# Patient Record
Sex: Male | Born: 1985 | Hispanic: No | Marital: Married | Smoking: Never smoker
Health system: Southern US, Community
[De-identification: ages and names within clinical notes are randomized; demographics above are authoritative.]

## PROBLEM LIST (undated history)

## (undated) DIAGNOSIS — J45909 Unspecified asthma, uncomplicated: Secondary | ICD-10-CM

## (undated) DIAGNOSIS — G4730 Sleep apnea, unspecified: Secondary | ICD-10-CM

## (undated) HISTORY — DX: Unspecified asthma, uncomplicated: J45.909

## (undated) HISTORY — DX: Sleep apnea, unspecified: G47.30

---

## 2018-06-23 DIAGNOSIS — J302 Other seasonal allergic rhinitis: Secondary | ICD-10-CM | POA: Diagnosis not present

## 2018-08-05 ENCOUNTER — Encounter: Payer: Self-pay | Admitting: Family Medicine

## 2018-08-05 ENCOUNTER — Ambulatory Visit (INDEPENDENT_AMBULATORY_CARE_PROVIDER_SITE_OTHER): Payer: BLUE CROSS/BLUE SHIELD | Admitting: Family Medicine

## 2018-08-05 VITALS — BP 124/70 | HR 58 | Temp 98.6°F | Ht 68.5 in | Wt 172.0 lb

## 2018-08-05 DIAGNOSIS — Z0001 Encounter for general adult medical examination with abnormal findings: Secondary | ICD-10-CM | POA: Diagnosis not present

## 2018-08-05 DIAGNOSIS — R1031 Right lower quadrant pain: Secondary | ICD-10-CM | POA: Diagnosis not present

## 2018-08-05 DIAGNOSIS — K5901 Slow transit constipation: Secondary | ICD-10-CM | POA: Insufficient documentation

## 2018-08-05 LAB — URINALYSIS, ROUTINE W REFLEX MICROSCOPIC
Bilirubin Urine: NEGATIVE
Hgb urine dipstick: NEGATIVE
Ketones, ur: NEGATIVE
Leukocytes,Ua: NEGATIVE
Nitrite: NEGATIVE
Specific Gravity, Urine: 1.025 (ref 1.000–1.030)
Total Protein, Urine: NEGATIVE
Urine Glucose: NEGATIVE
Urobilinogen, UA: 0.2 (ref 0.0–1.0)
pH: 5.5 (ref 5.0–8.0)

## 2018-08-05 LAB — COMPREHENSIVE METABOLIC PANEL
ALT: 25 U/L (ref 0–53)
AST: 24 U/L (ref 0–37)
Albumin: 4.7 g/dL (ref 3.5–5.2)
Alkaline Phosphatase: 64 U/L (ref 39–117)
BUN: 11 mg/dL (ref 6–23)
CO2: 28 mEq/L (ref 19–32)
Calcium: 9.5 mg/dL (ref 8.4–10.5)
Chloride: 105 mEq/L (ref 96–112)
Creatinine, Ser: 0.91 mg/dL (ref 0.40–1.50)
GFR: 96.42 mL/min (ref >60.00)
Glucose, Bld: 96 mg/dL (ref 70–99)
Potassium: 4.3 mEq/L (ref 3.5–5.1)
Sodium: 140 mEq/L (ref 135–145)
Total Bilirubin: 0.6 mg/dL (ref 0.2–1.2)
Total Protein: 7 g/dL (ref 6.0–8.3)

## 2018-08-05 LAB — CBC
HCT: 42.1 % (ref 39.0–52.0)
Hemoglobin: 15 g/dL (ref 13.0–17.0)
MCHC: 35.6 g/dL (ref 30.0–36.0)
MCV: 84.6 fl (ref 78.0–100.0)
Platelets: 165 10*3/uL (ref 150.0–400.0)
RBC: 4.98 Mil/uL (ref 4.22–5.81)
RDW: 13.9 % (ref 11.5–15.5)
WBC: 4.3 10*3/uL (ref 4.0–10.5)

## 2018-08-05 LAB — VITAMIN D 25 HYDROXY (VIT D DEFICIENCY, FRACTURES): VITD: 39.65 ng/mL (ref 30.00–100.00)

## 2018-08-05 LAB — LIPID PANEL
Cholesterol: 156 mg/dL (ref 0–200)
HDL: 45.6 mg/dL (ref >39.00)
LDL Cholesterol: 92 mg/dL (ref 0–99)
NonHDL: 110.83
Total CHOL/HDL Ratio: 3
Triglycerides: 95 mg/dL (ref 0.0–149.0)
VLDL: 19 mg/dL (ref 0.0–40.0)

## 2018-08-05 LAB — TSH: TSH: 2.24 u[IU]/mL (ref 0.35–4.50)

## 2018-08-05 MED ORDER — PSYLLIUM 58.6 % PO POWD: ORAL | 12 refills | Status: DC

## 2018-08-05 NOTE — Patient Instructions (Signed)
Constipation, Adult Constipation is when a person has fewer bowel movements in a week than normal, has difficulty having a bowel movement, or has stools that are dry, hard, or larger than normal. Constipation may be caused by an underlying condition. It may become worse with age if a person takes certain medicines and does not take in enough fluids. Follow these instructions at home: Eating and drinking   Eat foods that have a lot of fiber, such as fresh fruits and vegetables, whole grains, and beans.  Limit foods that are high in fat, low in fiber, or overly processed, such as french fries, hamburgers, cookies, candies, and soda.  Drink enough fluid to keep your urine clear or pale yellow. General instructions  Exercise regularly or as told by your health care provider.  Go to the restroom when you have the urge to go. Do not hold it in.  Take over-the-counter and prescription medicines only as told by your health care provider. These include any fiber supplements.  Practice pelvic floor retraining exercises, such as deep breathing while relaxing the lower abdomen and pelvic floor relaxation during bowel movements.  Watch your condition for any changes.  Keep all follow-up visits as told by your health care provider. This is important. Contact a health care provider if:  You have pain that gets worse.  You have a fever.  You do not have a bowel movement after 4 days.  You vomit.  You are not hungry.  You lose weight.  You are bleeding from the anus.  You have thin, pencil-like stools. Get help right away if:  You have a fever and your symptoms suddenly get worse.  You leak stool or have blood in your stool.  Your abdomen is bloated.  You have severe pain in your abdomen.  You feel dizzy or you faint. This information is not intended to replace advice given to you by your health care provider. Make sure you discuss any questions you have with your health care  provider. Document Released: 11/25/2003 Document Revised: 09/16/2015 Document Reviewed: 08/17/2015 Elsevier Interactive Patient Education  2019 [employer]  Health Maintenance, Male A healthy lifestyle and preventive care is important for your health and wellness. Ask your health care provider about what schedule of regular examinations is right for you. What should I know about weight and diet? Eat a Healthy Diet  Eat plenty of vegetables, fruits, whole grains, low-fat dairy products, and lean protein.  Do not eat a lot of foods high in solid fats, added sugars, or salt.  Maintain a Healthy Weight Regular exercise can help you achieve or maintain a healthy weight. You should:  Do at least 150 minutes of exercise each week. The exercise should increase your heart rate and make you sweat (moderate-intensity exercise).  Do strength-training exercises at least twice a week. Watch Your Levels of Cholesterol and Blood Lipids  Have your blood tested for lipids and cholesterol every 5 years starting at 32 years of age. If you are at high risk for heart disease, you should start having your blood tested when you are 32 years old. You may need to have your cholesterol levels checked more often if: ? Your lipid or cholesterol levels are high. ? You are older than 32 years of age. ? You are at high risk for heart disease. What should I know about cancer screening? Many types of cancers can be detected early and may often be prevented. Lung Cancer  You should be screened  every year for lung cancer if: ? You are a current smoker who has smoked for at least 30 years. ? You are a former smoker who has quit within the past 15 years.  Talk to your health care provider about your screening options, when you should start screening, and how often you should be screened. Colorectal Cancer  Routine colorectal cancer screening usually begins at 31 years of age and should be repeated every 5-10 years  until you are 32 years old. You may need to be screened more often if early forms of precancerous polyps or small growths are found. Your health care provider may recommend screening at an earlier age if you have risk factors for colon cancer.  Your health care provider may recommend using home test kits to check for hidden blood in the stool.  A small camera at the end of a tube can be used to examine your colon (sigmoidoscopy or colonoscopy). This checks for the earliest forms of colorectal cancer. Prostate and Testicular Cancer  Depending on your age and overall health, your health care provider may do certain tests to screen for prostate and testicular cancer.  Talk to your health care provider about any symptoms or concerns you have about testicular or prostate cancer. Skin Cancer  Check your skin from head to toe regularly.  Tell your health care provider about any new moles or changes in moles, especially if: ? There is a change in a mole's size, shape, or color. ? You have a mole that is larger than a pencil eraser.  Always use sunscreen. Apply sunscreen liberally and repeat throughout the day.  Protect yourself by wearing long sleeves, pants, a wide-brimmed hat, and sunglasses when outside. What should I know about heart disease, diabetes, and high blood pressure?  If you are 41-41 years of age, have your blood pressure checked every 3-5 years. If you are 54 years of age or older, have your blood pressure checked every year. You should have your blood pressure measured twice-once when you are at a hospital or clinic, and once when you are not at a hospital or clinic. Record the average of the two measurements. To check your blood pressure when you are not at a hospital or clinic, you can use: ? An automated blood pressure machine at a pharmacy. ? A home blood pressure monitor.  Talk to your health care provider about your target blood pressure.  If you are between 108-79 years  old, ask your health care provider if you should take aspirin to prevent heart disease.  Have regular diabetes screenings by checking your fasting blood sugar level. ? If you are at a normal weight and have a low risk for diabetes, have this test once every three years after the age of 61. ? If you are overweight and have a high risk for diabetes, consider being tested at a younger age or more often.  A one-time screening for abdominal aortic aneurysm (AAA) by ultrasound is recommended for men aged 65-75 years who are current or former smokers. What should I know about preventing infection? Hepatitis B If you have a higher risk for hepatitis B, you should be screened for this virus. Talk with your health care provider to find out if you are at risk for hepatitis B infection. Hepatitis C Blood testing is recommended for:  Everyone born from [birth year] through 1964.  Anyone with known risk factors for hepatitis C. Sexually Transmitted Diseases (STDs)  You should be screened  each year for STDs including gonorrhea and chlamydia if: ? You are sexually active and are younger than 32 years of age. ? You are older than 32 years of age and your health care provider tells you that you are at risk for this type of infection. ? Your sexual activity has changed since you were last screened and you are at an increased risk for chlamydia or gonorrhea. Ask your health care provider if you are at risk.  Talk with your health care provider about whether you are at high risk of being infected with HIV. Your health care provider may recommend a prescription medicine to help prevent HIV infection. What else can I do?  Schedule regular health, dental, and eye exams.  Stay current with your vaccines (immunizations).  Do not use any tobacco products, such as cigarettes, chewing tobacco, and e-cigarettes. If you need help quitting, ask your health care provider.  Limit alcohol intake to no more than 2 drinks per  day. One drink equals 12 ounces of beer, 5 ounces of wine, or 1 ounces of hard liquor.  Do not use street drugs.  Do not share needles.  Ask your health care provider for help if you need support or information about quitting drugs.  Tell your health care provider if you often feel depressed.  Tell your health care provider if you have ever been abused or do not feel safe at home. This information is not intended to replace advice given to you by your health care provider. Make sure you discuss any questions you have with your health care provider. Document Released: 08/25/2007 Document Revised: 10/26/2015 Document Reviewed: 11/30/2014 Elsevier Interactive Patient Education  2019 [employer].

## 2018-08-05 NOTE — Progress Notes (Signed)
Established Patient Office Visit  Subjective:  Patient ID: Patrick Parsons, male    DOB: October 05, 1985  Age: 33 y.o. MRN: [id number]  CC:  Chief Complaint  Patient presents with  . Establish Care  . Annual Exam    HPI Patrick Parsons presents for a complete physical.  He is fasting this morning.  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  His parents live back in [place].  He has a cousin who lives close by.  Patient does not smoke drink or use illicit drugs.  He is currently not exercising much.  He enjoys good health as far as he knows.  He has noted some intermittent right lower quadrant pain over the last month or 2.  He is also been dealing with constipation over this time.  He does not report any weight loss night sweats blood in his stool or urine.  He has had no abdominal surgeries.  He is recently tried to increase the fiber in his diet.  His father is 21 and suffers from diabetes and heart disease.  Patient is concerned about diabetes and himself.  He denies weight loss increased urination other than when he drinks more fluids.  Denies a decrease in his urine flow or difficulty with urination.  History reviewed. No pertinent past medical history.  History reviewed. No pertinent surgical history.  Family History  Problem Relation Age of Onset  . Diabetes Father   . Heart disease Father     Social History   Socioeconomic History  . Marital status: Married    Spouse name: Not on file  . Number of children: Not on file  . Years of education: Not on file  . Highest education level: Not on file  Occupational History  . Not on file  Social Needs  . Financial resource strain: Not on file  . Food insecurity:    Worry: Not on file    Inability: Not on file  . Transportation needs:    Medical: Not on file    Non-medical: Not on file  Tobacco Use  . Smoking status: Never Smoker  . Smokeless tobacco: Never Used  Substance and Sexual Activity  . Alcohol use: Never     Frequency: Never  . Drug use: Never  . Sexual activity: Not on file  Lifestyle  . Physical activity:    Days per week: Not on file    Minutes per session: Not on file  . Stress: Not on file  Relationships  . Social connections:    Talks on phone: Not on file    Gets together: Not on file    Attends religious service: Not on file    Active member of club or organization: Not on file    Attends meetings of clubs or organizations: Not on file    Relationship status: Not on file  . Intimate partner violence:    Fear of current or ex partner: Not on file    Emotionally abused: Not on file    Physically abused: Not on file    Forced sexual activity: Not on file  Other Topics Concern  . Not on file  Social History Narrative  . Not on file    No outpatient medications prior to visit.   No facility-administered medications prior to visit.     No Known Allergies  ROS Review of Systems  Constitutional: Negative for chills, diaphoresis, fatigue, fever and unexpected weight change.  HENT: Negative.   Eyes: Negative  for photophobia and visual disturbance.  Respiratory: Negative.   Cardiovascular: Negative.   Gastrointestinal: Positive for abdominal pain and constipation. Negative for anal bleeding, blood in stool, diarrhea, nausea, rectal pain and vomiting.  Endocrine: Negative for polyphagia and polyuria.  Genitourinary: Negative for difficulty urinating and urgency.  Musculoskeletal: Negative for arthralgias, gait problem and myalgias.  Skin: Negative for pallor and wound.  Allergic/Immunologic: Negative for immunocompromised state.  Neurological: Negative for light-headedness and headaches.  Hematological: Does not bruise/bleed easily.  Psychiatric/Behavioral: Negative.       Objective:    Physical Exam  Constitutional: He is oriented to person, place, and time. He appears well-developed and well-nourished. No distress.  HENT:  Head: Normocephalic and atraumatic.   Right Ear: External ear normal.  Left Ear: External ear normal.  Mouth/Throat: Oropharynx is clear and moist. No oropharyngeal exudate.  Eyes: Pupils are equal, round, and reactive to light. Conjunctivae and EOM are normal. Right eye exhibits no discharge. Left eye exhibits no discharge. No scleral icterus.  Neck: Neck supple. No JVD present. No tracheal deviation present. No thyromegaly present.  Cardiovascular: Normal rate, regular rhythm and normal heart sounds.  Pulmonary/Chest: Effort normal and breath sounds normal. No stridor.  Abdominal: Soft. Bowel sounds are normal. He exhibits no distension. There is no abdominal tenderness. There is no rebound and no guarding.  Musculoskeletal:        General: No edema.  Lymphadenopathy:    He has no cervical adenopathy.  Neurological: He is alert and oriented to person, place, and time.  Skin: Skin is warm and dry. He is not diaphoretic.  Psychiatric: He has a normal mood and affect. His behavior is normal.    BP 124/70   Pulse (!) 58   Temp 98.6 F (37 C) (Oral)   Ht 5' 8.5" (1.74 m)   Wt 172 lb (78 kg)   SpO2 98%   BMI 25.77 kg/m  Wt Readings from Last 3 Encounters:  08/05/18 172 lb (78 kg)   BP Readings from Last 3 Encounters:  08/05/18 124/70   Guideline developer:  UpToDate (see UpToDate for funding source) Date Released: June 2014  Health Maintenance Due  Topic Date Due  . HIV Screening  05/19/2001  . TETANUS/TDAP  05/19/2005    There are no preventive care reminders to display for this patient.  No results found for: TSH No results found for: WBC, HGB, HCT, MCV, PLT No results found for: NA, K, CHLORIDE, CO2, GLUCOSE, BUN, CREATININE, BILITOT, ALKPHOS, AST, ALT, PROT, ALBUMIN, CALCIUM, ANIONGAP, EGFR, GFR No results found for: CHOL No results found for: HDL No results found for: LDLCALC No results found for: TRIG No results found for: CHOLHDL No results found for: HGBA1C    Assessment & Plan:   Problem List  Items Addressed This Visit      Digestive   Constipation by delayed colonic transit   Relevant Medications   psyllium (METAMUCIL SMOOTH TEXTURE) 58.6 % powder   Other Relevant Orders   TSH     Other   Encounter for health maintenance examination with abnormal findings - Primary   Relevant Orders   CBC   Comprehensive metabolic panel   Lipid panel   Urinalysis, Routine w reflex microscopic   VITAMIN D 25 Hydroxy (Vit-D Deficiency, Fractures)   Right lower quadrant abdominal pain   Relevant Orders   CBC   Comprehensive metabolic panel   Urinalysis, Routine w reflex microscopic   TSH  Meds ordered this encounter  Medications  . psyllium (METAMUCIL SMOOTH TEXTURE) 58.6 % powder    Sig: Dissolve one packet in 8oz of water up to 3 times daily as needed for constipation.    Dispense:  283 g    Refill:  12    Follow-up: Return in about 1 month (around 09/05/2018).    Encouraged patient to increase his exercise and increase the fiber in his diet naturally.  He will try Metamucil.  He was given information on health maintenance and disease prevention.  He was also given information on constipation.  Believe that his abdominal pain is probably related to his constipation.  He will follow-up in 1 month for recheck.

## 2018-08-07 ENCOUNTER — Encounter: Payer: Self-pay | Admitting: Family Medicine

## 2018-09-03 ENCOUNTER — Telehealth: Payer: Self-pay

## 2018-09-03 NOTE — Telephone Encounter (Signed)

## 2018-09-04 ENCOUNTER — Encounter: Payer: Self-pay | Admitting: Family Medicine

## 2018-09-04 ENCOUNTER — Ambulatory Visit (INDEPENDENT_AMBULATORY_CARE_PROVIDER_SITE_OTHER): Payer: BC Managed Care – PPO | Admitting: Family Medicine

## 2018-09-04 VITALS — BP 110/70 | HR 76 | Ht 68.5 in | Wt 176.1 lb

## 2018-09-04 DIAGNOSIS — J301 Allergic rhinitis due to pollen: Secondary | ICD-10-CM

## 2018-09-04 DIAGNOSIS — J45909 Unspecified asthma, uncomplicated: Secondary | ICD-10-CM | POA: Insufficient documentation

## 2018-09-04 NOTE — Patient Instructions (Signed)
Allergic Rhinitis, Adult Allergic rhinitis is an allergic reaction that affects the mucous membrane inside the nose. It causes sneezing, a runny or stuffy nose, and the feeling of mucus going down the back of the throat (postnasal drip). Allergic rhinitis can be mild to severe. There are two types of allergic rhinitis:  Seasonal. This type is also called hay fever. It happens only during certain seasons.  Perennial. This type can happen at any time of the year. What are the causes? This condition happens when the body's defense system (immune system) responds to certain harmless substances called allergens as though they were germs.  Seasonal allergic rhinitis is triggered by pollen, which can come from grasses, trees, and weeds. Perennial allergic rhinitis may be caused by:  House dust mites.  Pet dander.  Mold spores. What are the signs or symptoms? Symptoms of this condition include:  Sneezing.  Runny or stuffy nose (nasal congestion).  Postnasal drip.  Itchy nose.  Tearing of the eyes.  Trouble sleeping.  Daytime sleepiness. How is this diagnosed? This condition may be diagnosed based on:  Your medical history.  A physical exam.  Tests to check for related conditions, such as: ? Asthma. ? Pink eye. ? Ear infection. ? Upper respiratory infection.  Tests to find out which allergens trigger your symptoms. These may include skin or blood tests. How is this treated? There is no cure for this condition, but treatment can help control symptoms. Treatment may include:  Taking medicines that block allergy symptoms, such as antihistamines. Medicine may be given as a shot, nasal spray, or pill.  Avoiding the allergen.  Desensitization. This treatment involves getting ongoing shots until your body becomes less sensitive to the allergen. This treatment may be done if other treatments do not help.  If taking medicine and avoiding the allergen does not work, new, stronger  medicines may be prescribed. Follow these instructions at home:  Find out what you are allergic to. Common allergens include smoke, dust, and pollen.  Avoid the things you are allergic to. These are some things you can do to help avoid allergens: ? Replace carpet with wood, tile, or vinyl flooring. Carpet can trap dander and dust. ? Do not smoke. Do not allow smoking in your home. ? Change your heating and air conditioning filter at least once a month. ? During allergy season:  Keep windows closed as much as possible.  Plan outdoor activities when pollen counts are lowest. This is usually during the evening hours.  When coming indoors, change clothing and shower before sitting on furniture or bedding.  Take over-the-counter and prescription medicines only as told by your health care provider.  Keep all follow-up visits as told by your health care provider. This is important. Contact a health care provider if:  You have a fever.  You develop a persistent cough.  You make whistling sounds when you breathe (you wheeze).  Your symptoms interfere with your normal daily activities. Get help right away if:  You have shortness of breath. Summary  This condition can be managed by taking medicines as directed and avoiding allergens.  Contact your health care provider if you develop a persistent cough or fever.  During allergy season, keep windows closed as much as possible. This information is not intended to replace advice given to you by your health care provider. Make sure you discuss any questions you have with your health care provider. Document Released: 11/21/2000 Document Revised: 04/05/2016 Document Reviewed: 04/05/2016 Elsevier Interactive  develop a persistent cough or fever.  · During allergy season, keep windows closed as much as possible.  This information is not intended to replace advice given to you by your health care provider. Make sure you discuss any questions you have with your health care provider.  Document Released: 11/21/2000 Document Revised: 04/05/2016 Document Reviewed: 04/05/2016  Elsevier Interactive Patient Education © 2019 Elsevier Inc.      Asthma, Adult    Asthma is a long-term (chronic) condition that causes recurrent episodes in which the airways become tight and narrow. The airways are the passages that lead from the nose and  mouth down into the lungs. Asthma episodes, also called asthma attacks, can cause coughing, wheezing, shortness of breath, and chest pain. The airways can also fill with mucus. During an attack, it can be difficult to breathe. Asthma attacks can range from minor to life threatening.  Asthma cannot be cured, but medicines and lifestyle changes can help control it and treat acute attacks.  What are the causes?  This condition is believed to be caused by inherited (genetic) and environmental factors, but its exact cause is not known.  There are many things that can bring on an asthma attack or make asthma symptoms worse (triggers). Asthma triggers are different for each person. Common triggers include:  · Mold.  · Dust.  · Cigarette smoke.  · Cockroaches.  · Things that can cause allergy symptoms (allergens), such as animal dander or pollen from trees or grass.  · Air pollutants such as household cleaners, wood smoke, smog, or chemical odors.  · Cold air, weather changes, and winds (which increase molds and pollen in the air).  · Strong emotional expressions such as crying or laughing hard.  · Stress.  · Certain medicines (such as aspirin) or types of medicines (such as beta-blockers).  · Sulfites in foods and drinks. Foods and drinks that may contain sulfites include dried fruit, potato chips, and sparkling grape juice.  · Infections or inflammatory conditions such as the flu, a cold, or inflammation of the nasal membranes (rhinitis).  · Gastroesophageal reflux disease (GERD).  · Exercise or strenuous activity.  What are the signs or symptoms?  Symptoms of this condition may occur right after asthma is triggered or many hours later. Symptoms include:  · Wheezing. This can sound like whistling when you breathe.  · Excessive nighttime or early morning coughing.  · Frequent or severe coughing with a common cold.  · Chest tightness.  · Shortness of breath.  · Tiredness (fatigue) with minimal activity.  How is this  diagnosed?  This condition is diagnosed based on:  · Your medical history.  · A physical exam.  · Tests, which may include:  ? Lung function studies and pulmonary studies (spirometry). These tests can evaluate the flow of air in your lungs.  ? Allergy tests.  ? Imaging tests, such as X-rays.  How is this treated?  There is no cure for this condition, but treatment can help control your symptoms. Treatment for asthma usually involves:  · Identifying and avoiding your asthma triggers.  · Using medicines to control your symptoms. Generally, two types of medicines are used to treat asthma:  ? Controller medicines. These help prevent asthma symptoms from occurring. They are usually taken every day.  ? Fast-acting reliever or rescue medicines. These quickly relieve asthma symptoms by widening the narrow and tight airways. They are used as needed and provide short-term relief.  · Using supplemental oxygen.   This may be needed during a severe episode.  · Using other medicines, such as:  ? Allergy medicines, such as antihistamines, if your asthma attacks are triggered by allergens.  ? Immune medicines (immunomodulators). These are medicines that help control the immune system.  · Creating an asthma action plan. An asthma action plan is a written plan for managing and treating your asthma attacks. This plan includes:  ? A list of your asthma triggers and how to avoid them.  ? Information about when medicines should be taken and when their dosage should be changed.  ? Instructions about using a device called a peak flow meter. A peak flow meter measures how well the lungs are working and the severity of your asthma. It helps you monitor your condition.  Follow these instructions at home:  Controlling your home environment  Control your home environment in the following ways to help avoid triggers and prevent asthma attacks:  · Change your heating and air conditioning filter regularly.  · Limit your use of fireplaces and wood  stoves.  · Get rid of pests (such as roaches and mice) and their droppings.  · Throw away plants if you see mold on them.  · Clean floors and dust surfaces regularly. Use unscented cleaning products.  · Try to have someone else vacuum for you regularly. Stay out of rooms while they are being vacuumed and for a short while afterward. If you vacuum, use a dust mask from a hardware store, a double-layered or microfilter vacuum cleaner bag, or a vacuum cleaner with a HEPA filter.  · Replace carpet with wood, tile, or vinyl flooring. Carpet can trap dander and dust.  · Use allergy-proof pillows, mattress covers, and box spring covers.  · Keep your bedroom a trigger-free room.  · Avoid pets and keep windows closed when allergens are in the air.  · Wash beddings every week in hot water and dry them in a dryer.  · Use blankets that are made of polyester or cotton.  · Clean bathrooms and kitchens with bleach. If possible, have someone repaint the walls in these rooms with mold-resistant paint. Stay out of the rooms that are being cleaned and painted.  · Wash your hands often with soap and water. If soap and water are not available, use hand sanitizer.  · Do not allow anyone to smoke in your home.  General instructions  · Take over-the-counter and prescription medicines only as told by your health care provider.  ? Speak with your health care provider if you have questions about how or when to take the medicines.  ? Make note if you are requiring more frequent dosages.  · Do not use any products that contain nicotine or tobacco, such as cigarettes and e-cigarettes. If you need help quitting, ask your health care provider. Also, avoid being exposed to secondhand smoke.  · Use a peak flow meter as told by your health care provider. Record and keep track of the readings.  · Understand and use the asthma action plan to help minimize, or stop an asthma attack, without needing to seek medical care.  · Make sure you stay up to date  on your yearly vaccinations as told by your health care provider. This may include vaccines for the flu and pneumonia.  · Avoid outdoor activities when allergen counts are high and when air quality is low.  · Wear a ski mask that covers your nose and mouth during outdoor winter activities. Exercise indoors   times a week.  Your peak flow reading is still at 50-79% of your personal best after following your action plan for 1 hour.  You have a fever. Get help right away if:  You are getting worse and do not respond to treatment during an asthma attack.  You are short of breath when at rest or when doing very little physical activity.  You have difficulty eating, drinking, or talking.  You have chest pain or tightness.  You develop a fast heartbeat or palpitations.  You have a bluish color to your lips or fingernails.  You are light-headed or dizzy, or you faint.  Your peak flow reading is less than 50% of your personal best.  You feel too tired to breathe normally. Summary  Asthma is a long-term (chronic) condition that causes recurrent episodes in which the airways  become tight and narrow. These episodes can cause coughing, wheezing, shortness of breath, and chest pain.  Asthma cannot be cured, but medicines and lifestyle changes can help control it and treat acute attacks.  Make sure you understand how to avoid triggers and how and when to use your medicines.  Asthma attacks can range from minor to life threatening. Get help right away if you have an asthma attack and do not respond to treatment with your usual rescue medicines. This information is not intended to replace advice given to you by your health care provider. Make sure you discuss any questions you have with your health care provider. Document Released: 02/26/2005 Document Revised: 04/02/2016 Document Reviewed: 04/02/2016 Elsevier Interactive Patient Education  2019 [employer].

## 2018-09-04 NOTE — Progress Notes (Signed)
Established Patient Office Visit  Subjective:  Patient ID: Patrick Parsons, male    DOB: [birth year]  Age: 33 y.o. MRN: [id number]  CC:  Chief Complaint  Patient presents with  . Follow-up  . Allergies    HPI Patrick Parsons presents for follow-up of his constipation and abdominal pain.  Both have improved status post using the Metamucil intermittently as needed.  He has also been exercising by walking and this seems to have helped to.  Reports stooling every 1 to 2 days.  There is been no blood loss weight loss or significant abdominal pain.  Ongoing history of allergy rhinitis.  He was diagnosed allergic to olive tree pollen back in [place].  He tells of wheezing after mowing his lawn.  He has no allergy history.  He does not smoke.  He called in for tele-visit and was given an inhaler and a pill to take.  He has used the inhaler intermittently as needed.  History reviewed. No pertinent past medical history.  History reviewed. No pertinent surgical history.  Family History  Problem Relation Age of Onset  . Diabetes Father   . Heart disease Father     Social History   Socioeconomic History  . Marital status: Married    Spouse name: Not on file  . Number of children: Not on file  . Years of education: Not on file  . Highest education level: Not on file  Occupational History  . Not on file  Social Needs  . Financial resource strain: Not on file  . Food insecurity    Worry: Not on file    Inability: Not on file  . Transportation needs    Medical: Not on file    Non-medical: Not on file  Tobacco Use  . Smoking status: Never Smoker  . Smokeless tobacco: Never Used  Substance and Sexual Activity  . Alcohol use: Never    Frequency: Never  . Drug use: Never  . Sexual activity: Not on file  Lifestyle  . Physical activity    Days per week: Not on file    Minutes per session: Not on file  . Stress: Not on file  Relationships  . Social Musicianconnections    Talks on phone: Not on  file    Gets together: Not on file    Attends religious service: Not on file    Active member of club or organization: Not on file    Attends meetings of clubs or organizations: Not on file    Relationship status: Not on file  . Intimate partner violence    Fear of current or ex partner: Not on file    Emotionally abused: Not on file    Physically abused: Not on file    Forced sexual activity: Not on file  Other Topics Concern  . Not on file  Social History Narrative  . Not on file    Outpatient Medications Prior to Visit  Medication Sig Dispense Refill  . psyllium (METAMUCIL SMOOTH TEXTURE) 58.6 % powder Dissolve one packet in 8oz of water up to 3 times daily as needed for constipation. 283 g 12   No facility-administered medications prior to visit.     No Known Allergies  ROS Review of Systems  Constitutional: Negative for chills, diaphoresis, fatigue, fever and unexpected weight change.  HENT: Positive for postnasal drip and sneezing.   Eyes: Negative for photophobia.  Respiratory: Positive for cough and wheezing.   Cardiovascular: Negative.   Gastrointestinal: Negative  for abdominal pain, anal bleeding, blood in stool, constipation and diarrhea.  Endocrine: Negative for polyphagia and polyuria.  Genitourinary: Negative.   Musculoskeletal: Negative for arthralgias and joint swelling.  Allergic/Immunologic: Negative for immunocompromised state.  Neurological: Negative for light-headedness and headaches.  Hematological: Does not bruise/bleed easily.  Psychiatric/Behavioral: Negative.       Objective:    Physical Exam  Constitutional: He is oriented to person, place, and time. He appears well-developed and well-nourished. No distress.  HENT:  Head: Normocephalic and atraumatic.  Right Ear: External ear normal.  Left Ear: External ear normal.  Eyes: Pupils are equal, round, and reactive to light. Conjunctivae are normal. Right eye exhibits no discharge. Left eye  exhibits no discharge. No scleral icterus.  Neck: Neck supple. No JVD present. No tracheal deviation present. No thyromegaly present.  Cardiovascular: Normal rate, regular rhythm and normal heart sounds.  Pulmonary/Chest: Effort normal and breath sounds normal. No stridor.  Abdominal: Bowel sounds are normal. He exhibits no distension. There is no abdominal tenderness. There is no rebound.  Musculoskeletal:        General: No edema.  Lymphadenopathy:    He has no cervical adenopathy.  Neurological: He is alert and oriented to person, place, and time.  Skin: Skin is warm and dry. He is not diaphoretic.  Psychiatric: He has a normal mood and affect. His behavior is normal.    BP 110/70   Pulse 76   Ht 5' 8.5" (1.74 m)   Wt 176 lb 2 oz (79.9 kg)   SpO2 99%   BMI 26.39 kg/m  Wt Readings from Last 3 Encounters:  09/04/18 176 lb 2 oz (79.9 kg)  08/05/18 172 lb (78 kg)   BP Readings from Last 3 Encounters:  09/04/18 110/70  08/05/18 124/70   Guideline developer:  UpToDate (see UpToDate for funding source) Date Released: June 2014  Health Maintenance Due  Topic Date Due  . HIV Screening  05/19/2001  . TETANUS/TDAP  05/19/2005    There are no preventive care reminders to display for this patient.  Lab Results  Component Value Date   TSH 2.24 08/05/2018   Lab Results  Component Value Date   WBC 4.3 08/05/2018   HGB 15.0 08/05/2018   HCT 42.1 08/05/2018   MCV 84.6 08/05/2018   PLT 165.0 08/05/2018   Lab Results  Component Value Date   NA 140 08/05/2018   K 4.3 08/05/2018   CO2 28 08/05/2018   GLUCOSE 96 08/05/2018   BUN 11 08/05/2018   CREATININE 0.91 08/05/2018   BILITOT 0.6 08/05/2018   ALKPHOS 64 08/05/2018   AST 24 08/05/2018   ALT 25 08/05/2018   PROT 7.0 08/05/2018   ALBUMIN 4.7 08/05/2018   CALCIUM 9.5 08/05/2018   GFR 96.42 08/05/2018   Lab Results  Component Value Date   CHOL 156 08/05/2018   Lab Results  Component Value Date   HDL 45.60  08/05/2018   Lab Results  Component Value Date   LDLCALC 92 08/05/2018   Lab Results  Component Value Date   TRIG 95.0 08/05/2018   Lab Results  Component Value Date   CHOLHDL 3 08/05/2018   No results found for: HGBA1C    Assessment & Plan:   Problem List Items Addressed This Visit      Respiratory   Mild reactive airways disease   Relevant Orders   Ambulatory referral to Allergy   Seasonal allergic rhinitis due to pollen - Primary   Relevant  Orders   Ambulatory referral to Allergy      No orders of the defined types were placed in this encounter.   Follow-up: Return if symptoms worsen or fail to improve.

## 2018-09-09 ENCOUNTER — Encounter: Payer: Self-pay | Admitting: Allergy & Immunology

## 2018-09-09 ENCOUNTER — Other Ambulatory Visit: Payer: Self-pay

## 2018-09-09 ENCOUNTER — Ambulatory Visit (INDEPENDENT_AMBULATORY_CARE_PROVIDER_SITE_OTHER): Payer: BC Managed Care – PPO | Admitting: Allergy & Immunology

## 2018-09-09 VITALS — BP 116/70 | HR 75 | Temp 98.1°F | Resp 16 | Ht 68.0 in | Wt 174.0 lb

## 2018-09-09 DIAGNOSIS — J302 Other seasonal allergic rhinitis: Secondary | ICD-10-CM

## 2018-09-09 DIAGNOSIS — J3089 Other allergic rhinitis: Secondary | ICD-10-CM | POA: Diagnosis not present

## 2018-09-09 DIAGNOSIS — J4530 Mild persistent asthma, uncomplicated: Secondary | ICD-10-CM | POA: Diagnosis not present

## 2018-09-09 MED ORDER — MONTELUKAST SODIUM 10 MG PO TABS: 10.0000 mg | ORAL_TABLET | Freq: Every day | ORAL | 5 refills | Status: DC

## 2018-09-09 MED ORDER — QVAR REDIHALER 80 MCG/ACT IN AERB: INHALATION_SPRAY | RESPIRATORY_TRACT | 5 refills | Status: DC

## 2018-09-09 NOTE — Patient Instructions (Addendum)
1. Mild persistent asthma, uncomplicated - Spirometry was technically normal, but it did improve even more with albuterol. - I think you would benefit from adding on an inhaled steroid to help decrease mucous production and decrease the effectiveness of the allergens interfering with your breathing. - Spacer sample and demonstration provided. - Daily controller medication(s): Singulair 10mg  daily and Qvar 60mcg Redihaler 2 puffs twice daily - Prior to physical activity: albuterol 2 puffs 10-15 minutes before physical activity. - Rescue medications: albuterol 4 puffs every 4-6 hours as needed - Asthma control goals:  * Full participation in all desired activities (may need albuterol before activity) * Albuterol use two time or less a week on average (not counting use with activity) * Cough interfering with sleep two time or less a month * Oral steroids no more than once a year * No hospitalizations  2. Seasonal allergic rhinitis due to pollen - Testing today showed: grasses, weeds, trees and indoor molds (grasses were the dominant allergen) - Copy of test results provided.  - Avoidance measures provided. - Continue with: Singulair (montelukast) 10mg  daily - Start taking: Zyrtec (cetirizine) 10mg  tablet once daily and Nasacort (triamcinolone) one spray per nostril daily (especially on the days when you will be outdoors and exposed to pollen) - You can use an extra dose of the antihistamine, if needed, for breakthrough symptoms.  - Consider nasal saline rinses 1-2 times daily to remove allergens from the nasal cavities as well as help with mucous clearance (this is especially helpful to do before the nasal sprays are given) - Consider allergy shots as a means of long-term control. - Allergy shots "re-train" and "reset" the immune system to ignore environmental allergens and decrease the resulting immune response to those allergens (sneezing, itchy watery eyes, runny nose, nasal congestion, etc).     - Allergy shots improve symptoms in 75-85% of patients.  - We can discuss more at the next appointment if the medications are not working for you.  3. Return in about 3 months (around 12/10/2018). This can be an in-person, a virtual Webex or a telephone follow up visit.   Please inform us of any Emergency Department visits, hospitalizations, or changes in symptoms. Call us before going to the ED for breathing or allergy symptoms since we might be able to fit you in for a sick visit. Feel free to contact us anytime with any questions, problems, or concerns.  It was a pleasure to meet you today!  Websites that have reliable patient information: 1. American Academy of Asthma, Allergy, and Immunology: www.aaaai.org 2. Food Allergy Research and Education (FARE): foodallergy.org 3. Mothers of Asthmatics: http://www.asthmacommunitynetwork.org 4. American College of Allergy, Asthma, and Immunology: www.acaai.org  "Like" [place] on Facebook and Instagram for our latest updates!      Make sure you are registered to vote! If you have moved or changed any of your contact information, you will need to get this updated before voting!  In some cases, you MAY be able to register to vote online: [email or web address]    Voter ID laws are NOT going into effect for the General Election in November 2020! DO NOT let this stop you from exercising your right to vote!   Absentee voting is the SAFEST way to vote during the coronavirus pandemic!   Download and print an absentee ballot request form at [email or web address] or you can scan the QR code below with your smart phone:      More information on absentee ballots can be found  here: [email or web address]     Reducing Pollen Exposure  The American Academy of Allergy, Asthma and Immunology suggests the following steps to reduce your exposure to pollen during allergy seasons.    1. Do not hang sheets or  clothing out to dry; pollen may collect on these items. 2. Do not mow lawns or spend time around freshly cut grass; mowing stirs up pollen. 3. Keep windows closed at night.  Keep car windows closed while driving. 4. Minimize morning activities outdoors, a time when pollen counts are usually at their highest. 5. Stay indoors as much as possible when pollen counts or humidity is high and on windy days when pollen tends to remain in the air longer. 6. Use air conditioning when possible.  Many air conditioners have filters that trap the pollen spores. 7. Use a HEPA room air filter to remove pollen form the indoor air you breathe.  Control of Mold Allergen   Mold and fungi can grow on a variety of surfaces provided certain temperature and moisture conditions exist.  Outdoor molds grow on plants, decaying vegetation and soil.  The major outdoor mold, Alternaria and Cladosporium, are found in very high numbers during hot and dry conditions.  Generally, a late Summer - Fall peak is seen for common outdoor fungal spores.  Rain will temporarily lower outdoor mold spore count, but counts rise rapidly when the rainy period ends.  The most important indoor molds are Aspergillus and Penicillium.  Dark, humid and poorly ventilated basements are ideal sites for mold growth.  The next most common sites of mold growth are the bathroom and the kitchen.   Indoor (Perennial) Mold Control   Positive indoor molds via skin testing: Trichophyton  1. Maintain humidity below 50%. 2. Clean washable surfaces with 5% bleach solution. 3. Remove sources e.g. contaminated carpets.

## 2018-09-09 NOTE — Progress Notes (Signed)
NEW PATIENT  Date of Service/Encounter:  09/09/18  Referring provider: Mliss SaxKremer, William Alfred, MD `   Assessment:   Mild persistent asthma, uncomplicated   Seasonal and perennial allergic rhinitis (grasses, weeds, trees and indoor molds)  Plan/Recommendations:   1. Mild persistent asthma, uncomplicated - Spirometry was technically normal, but it did improve even more with albuterol. - I think you would benefit from adding on an inhaled steroid to help decrease mucous production and decrease the effectiveness of the allergens interfering with your breathing. - Spacer sample and demonstration provided. - Daily controller medication(s): Singulair 10mg  daily and Qvar 80mcg Redihaler 2 puffs twice daily - Prior to physical activity: albuterol 2 puffs 10-15 minutes before physical activity. - Rescue medications: albuterol 4 puffs every 4-6 hours as needed - Asthma control goals:  * Full participation in all desired activities (may need albuterol before activity) * Albuterol use two time or less a week on average (not counting use with activity) * Cough interfering with sleep two time or less a month * Oral steroids no more than once a year * No hospitalizations  2. Seasonal allergic rhinitis due to pollen - Testing today showed: grasses, weeds, trees and indoor molds (grasses were the dominant allergen) - Copy of test results provided.  - Avoidance measures provided. - Continue with: Singulair (montelukast) 10mg  daily - Start taking: Zyrtec (cetirizine) 10mg  tablet once daily and Nasacort (triamcinolone) one spray per nostril daily (especially on the days when you will be outdoors and exposed to pollen) - You can use an extra dose of the antihistamine, if needed, for breakthrough symptoms.  - Consider nasal saline rinses 1-2 times daily to remove allergens from the nasal cavities as well as help with mucous clearance (this is especially helpful to do before the nasal sprays are  given) - Consider allergy shots as a means of long-term control. - Allergy shots "re-train" and "reset" the immune system to ignore environmental allergens and decrease the resulting immune response to those allergens (sneezing, itchy watery eyes, runny nose, nasal congestion, etc).    - Allergy shots improve symptoms in 75-85% of patients.  - We can discuss more at the next appointment if the medications are not working for you.  3. Return in about 3 months (around 12/10/2018). This can be an in-person, a virtual Webex or a telephone follow up visit.    Subjective:   Patrick BongKamal Parsons is a 33 y.o. male presenting today for evaluation of  Chief Complaint  Patient presents with  . Pruritis    when outside   . Allergic Rhinitis     Patrick Parsons has a history of the following: Patient Active Problem List   Diagnosis Date Noted  . Mild reactive airways disease 09/04/2018  . Seasonal allergic rhinitis due to pollen 09/04/2018  . Encounter for health maintenance examination with abnormal findings 08/05/2018  . Constipation by delayed colonic transit 08/05/2018  . Right lower quadrant abdominal pain 08/05/2018    History obtained from: chart review and patient.  Patrick Parsons was referred by Mliss SaxKremer, William Alfred, MD.     Patrick Parsons is a 33 y.o. male presenting for an evaluation of pruritis associated with mowing the grass.   He was givne montelukast at some point and a rescue inhaler to use as needed. [place] was [place], evidently he was positive to multiple items.    Asthma/Respiratory Symptom History: He has no official diagnosis of asthma.  He was given albuterol 1 week ago, which he is used a few times with improvement in his symptoms.  He seems to only have symptoms when he is outdoors, especially with mowing the lawn.  He has never needed prednisone for symptoms.  He has never needed to go to the hospital for symptoms.  He endorses good  nighttime sleep.  ACT score is 21, indicating excellent asthma control.  Allergic Rhinitis Symptom History: He tells me that he has wheezing and nasal congestion when he is outdoors mowing his lawn.  MR echo, he had itchy watery eyes, but this is been less of an issue in the interim.  He does not use any nose sprays.  He does not have any antihistamines aside from the montelukast, which was started around 1 week ago.  He was told to only use the montelukast as needed.  He does not need antibiotics at all.  Otherwise, there is no history of other atopic diseases, including food allergies, drug allergies, stinging insect allergies, eczema, urticaria or contact dermatitis. There is no significant infectious history. Vaccinations are up to date.    Past Medical History: Patient Active Problem List   Diagnosis Date Noted  . Mild reactive airways disease 09/04/2018  . Seasonal allergic rhinitis due to pollen 09/04/2018  . Encounter for health maintenance examination with abnormal findings 08/05/2018  . Constipation by delayed colonic transit 08/05/2018  . Right lower quadrant abdominal pain 08/05/2018    Medication List:  Allergies as of 09/09/2018   No Known Allergies     Medication List       Accurate as of September 09, 2018  3:35 PM. If you have any questions, ask your nurse or doctor.        montelukast 10 MG tablet Commonly known as: SINGULAIR Take 1 tablet (10 mg total) by mouth at bedtime. Started by: Valentina Shaggy, MD   psyllium 58.6 % powder Commonly known as: Metamucil Smooth Texture Dissolve one packet in [place] of water up to 3 times daily as needed for constipation.   Qvar RediHaler 80 MCG/ACT inhaler Generic drug: beclomethasone Inhale 2 puffs into the lungs twice daily Started by: Valentina Shaggy, MD       Birth History: non-contributory  Developmental History: non-contributory.   Past Surgical History: History reviewed. No pertinent surgical history.    Family History: Family History  Problem Relation Age of Onset  . Diabetes Father   . Heart disease Father      Social History: Patrick Parsons lives at home by himself.  He has no animals at home.  He is not a smoker.  He lives in a house that he just moved into around 8 months ago.  He has lived in [place] for around 4 years.  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  He has also spent some time in [place] both last summer for 3 months and earlier in February 2020.  Review of Systems  Constitutional: Negative.  Negative for chills, fever, malaise/fatigue and weight loss.  HENT: Positive for congestion. Negative for ear discharge and ear pain.   Eyes: Negative for pain, discharge and redness.  Respiratory: Positive for cough, shortness of breath and wheezing. Negative for sputum production.   Cardiovascular: Negative.  Negative for chest pain and palpitations.  Gastrointestinal: Negative for abdominal pain, heartburn, nausea and vomiting.  Skin: Negative.  Negative for itching and rash.  Neurological: Negative for dizziness and headaches.  Endo/Heme/Allergies: Negative for environmental  allergies. Does not bruise/bleed easily.       Objective:   Blood pressure 116/70, pulse 75, temperature 98.1 F (36.7 C), resp. rate 16, height 5\' 8"  (1.727 m), weight 174 lb (78.9 kg), SpO2 96 %. Body mass index is 26.46 kg/m.   Physical Exam:   Physical Exam  Constitutional: He appears well-developed.  Pleasant male.  HENT:  Head: Normocephalic and atraumatic.  Right Ear: Tympanic membrane, external ear and ear canal normal. No drainage, swelling or tenderness. Tympanic membrane is not injected, not scarred, not erythematous, not retracted and not bulging.  Left Ear: Tympanic membrane, external ear and ear canal normal. No drainage, swelling or tenderness. Tympanic membrane is not injected, not scarred, not erythematous, not retracted and not bulging.  Nose: Mucosal edema and  rhinorrhea present. No nasal deformity or septal deviation. No epistaxis. Right sinus exhibits no maxillary sinus tenderness and no frontal sinus tenderness. Left sinus exhibits no maxillary sinus tenderness and no frontal sinus tenderness.  Mouth/Throat: Uvula is midline and oropharynx is clear and moist. Mucous membranes are not pale and not dry.  Pale boggy turbinates bilaterally with clear discharge.  Uvula midline.  There is some cobblestoning in the posterior oropharynx with generalized erythema.  Eyes: Pupils are equal, round, and reactive to light. Conjunctivae and EOM are normal. Right eye exhibits no chemosis and no discharge. Left eye exhibits no chemosis and no discharge. Right conjunctiva is not injected. Left conjunctiva is not injected.  Cardiovascular: Normal rate, regular rhythm and normal heart sounds.  Respiratory: Effort normal and breath sounds normal. No accessory muscle usage. No tachypnea. No respiratory distress. He has no wheezes. He has no rhonchi. He has no rales. He exhibits no tenderness.  Moving air well in all lung fields.  No increased work of breathing.  GI: There is no abdominal tenderness. There is no rebound and no guarding.  Lymphadenopathy:       Head (right side): No submandibular, no tonsillar and no occipital adenopathy present.       Head (left side): No submandibular, no tonsillar and no occipital adenopathy present.    He has no cervical adenopathy.  Neurological: He is alert.  Skin: No abrasion, no petechiae and no rash noted. Rash is not papular, not vesicular and not urticarial. No erythema. No pallor.  No eczematous lesions noted.  Psychiatric: He has a normal mood and affect.     Diagnostic studies:    Spirometry: results normal (FEV1: 3.46/89%, FVC: 3.78/81%, FEV1/FVC: 92%).    Spirometry consistent with normal pattern. Four puffs of albuterol via MDI treatment given in clinic with significant improvement in FEV1 and FVC per ATS criteria.   Allergy Studies:    Airborne Adult Perc - 09/09/18 1413    Time Antigen Placed  1410    Allergen Manufacturer  Waynette ButteryGreer    Location  Back    Number of Test  59    Panel 1  Select    1. Control-Buffer 50% Glycerol  Negative    2. Control-Histamine 1 mg/ml  2+    4. Bahia  3+    5. [place]  3+    6. Johnson  Negative    7. Kentucky Blue  4+    8. Meadow Fescue  4+    9. Perennial Rye  3+    10. Sweet Vernal  4+    11. Timothy  4+    12. Cocklebur  Negative    13. Burweed Marshelder  Negative  14. Ragweed, short  Negative    15. Ragweed, Giant  Negative    16. Plantain,  English  2+    17. Lamb's Quarters  Negative    18. Sheep Sorrell  Negative    19. Rough Pigweed  Negative    20. Marsh Elder, Rough  Negative    21. Mugwort, Common  Negative    22. Ash mix  Negative    23. Birch mix  Negative    24. Beech American  Negative    25. Box, Elder  Negative    26. Cedar, red  2+    27. Cottonwood, Guinea[place]iss[place]astern  Negative    28. Elm mix  Negative    29. Hickory mix  Negative    30. Maple mix  Negative    31. Oak, Guinea[place]iss[place]astern mix  Negative    32. Pecan Pollen  Negative    33. Pine mix  Negative    34. Sycamore Eastern  Negative    35. Walnut, Black Pollen  Negative    36. Alternaria alternata  Negative    37. Cladosporium Herbarum  Negative    38. Aspergillus mix  Negative    39. Penicillium mix  Negative    40. Bipolaris sorokiniana (Helminthosporium)  Negative    41. Drechslera spicifera (Curvularia)  Negative    42. Mucor plumbeus  Negative    43. Fusarium moniliforme  Negative    44. Aureobasidium pullulans (pullulara)  Negative    45. Rhizopus oryzae  Negative    46. Botrytis cinera  Negative    47. Epicoccum nigrum  Negative    48. Phoma betae  Negative    49. Candida Albicans  Negative    50. Trichophyton mentagrophytes  2+    51. Mite, D Farinae  5,000 AU/ml  Negative    52. Mite, D Pteronyssinus  5,000 AU/ml  Negative    53. Cat Hair 10,000 BAU/ml  Negative    54.   Dog Epithelia  Negative    55. Mixed Feathers  Negative    56. Horse Epithelia  Negative    57. Cockroach, German  Negative    58. Mouse  Negative    59. Tobacco Leaf  Negative       Allergy testing results were read and interpreted by myself, documented by clinical staff.         Malachi BondsJoel Adia Crammer, MD [facility]

## 2018-09-15 ENCOUNTER — Encounter: Payer: Self-pay | Admitting: Allergy & Immunology

## 2018-09-22 ENCOUNTER — Telehealth: Payer: Self-pay

## 2018-09-22 ENCOUNTER — Encounter: Payer: Self-pay | Admitting: Family Medicine

## 2018-09-22 NOTE — Telephone Encounter (Signed)

## 2018-09-25 ENCOUNTER — Encounter: Payer: Self-pay | Admitting: Family Medicine

## 2018-09-25 ENCOUNTER — Other Ambulatory Visit (HOSPITAL_COMMUNITY)
Admission: RE | Admit: 2018-09-25 | Discharge: 2018-09-25 | Disposition: A | Discharge status: Home / Self Care | Payer: BC Managed Care – PPO | Source: Ambulatory Visit | Attending: Family Medicine | Admitting: Family Medicine

## 2018-09-25 ENCOUNTER — Ambulatory Visit (INDEPENDENT_AMBULATORY_CARE_PROVIDER_SITE_OTHER): Payer: BC Managed Care – PPO | Admitting: Family Medicine

## 2018-09-25 VITALS — BP 110/70 | HR 73 | Ht 68.0 in

## 2018-09-25 DIAGNOSIS — R350 Frequency of micturition: Secondary | ICD-10-CM | POA: Diagnosis not present

## 2018-09-25 DIAGNOSIS — R351 Nocturia: Secondary | ICD-10-CM | POA: Insufficient documentation

## 2018-09-25 DIAGNOSIS — R339 Retention of urine, unspecified: Secondary | ICD-10-CM | POA: Insufficient documentation

## 2018-09-25 NOTE — Patient Instructions (Signed)
Urinary Frequency, Adult Urinary frequency means urinating more often than usual. You may urinate every 1-2 hours even though you drink a normal amount of fluid and do not have a bladder infection or condition. Although you urinate more often than normal, the total amount of urine produced in a day is normal. With urinary frequency, you may have an urgent need to urinate often. The stress and anxiety of needing to find a bathroom quickly can make this urge worse. This condition may go away on its own or you may need treatment at home. Home treatment may include bladder training, exercises, taking medicines, or making changes to your diet. Follow these instructions at home: Bladder health   Keep a bladder diary if told by your health care provider. Keep track of: ? What you eat and drink. ? How often you urinate. ? How much you urinate.  Follow a bladder training program if told by your health care provider. This may include: ? Learning to delay going to the bathroom. ? Double urinating (voiding). This helps if you are not completely emptying your bladder. ? Scheduled voiding.  Do Kegel exercises as told by your health care provider. Kegel exercises strengthen the muscles that help control urination, which may help the condition. Eating and drinking  If told by your health care provider, make diet changes, such as: ? Avoiding caffeine. ? Drinking fewer fluids, especially alcohol. ? Not drinking in the evening. ? Avoiding foods or drinks that may irritate the bladder. These include coffee, tea, soda, artificial sweeteners, citrus, tomato-based foods, and chocolate. ? Eating foods that help prevent or ease constipation. Constipation can make this condition worse. Your health care provider may recommend that you:  Drink enough fluid to keep your urine pale yellow.  Take over-the-counter or prescription medicines.  Eat foods that are high in fiber, such as beans, whole grains, and fresh  fruits and vegetables.  Limit foods that are high in fat and processed sugars, such as fried or sweet foods. General instructions  Take over-the-counter and prescription medicines only as told by your health care provider.  Keep all follow-up visits as told by your health care provider. This is important. Contact a health care provider if:  You start urinating more often.  You feel pain or irritation when you urinate.  You notice blood in your urine.  Your urine looks cloudy.  You develop a fever.  You begin vomiting. Get help right away if:  You are unable to urinate. Summary  Urinary frequency means urinating more often than usual. With urinary frequency, you may urinate every 1-2 hours even though you drink a normal amount of fluid and do not have a bladder infection or other bladder condition.  Your health care provider may recommend that you keep a bladder diary, follow a bladder training program, or make dietary changes.  If told by your health care provider, do Kegel exercises to strengthen the muscles that help control urination.  Take over-the-counter and prescription medicines only as told by your health care provider.  Contact a health care provider if your symptoms do not improve or get worse. This information is not intended to replace advice given to you by your health care provider. Make sure you discuss any questions you have with your health care provider. Document Released: 12/23/2008 Document Revised: 09/05/2017 Document Reviewed: 09/05/2017 Elsevier Patient Education  2020 Elsevier Inc.  

## 2018-09-25 NOTE — Progress Notes (Signed)
Established Patient Office Visit  Subjective:  Patient ID: Patrick Parsons, male    DOB: 11-01-1985  Age: 33 y.o. MRN: [id number]  CC:  Chief Complaint  Patient presents with  . Follow-up    HPI Patrick BongKamal Attia presents for evaluation of an ongoing history of urinary, nocturia up to 7 times nightly with occasional incomplete emptying.  Patient drinks copious amounts of fluid throughout the day.  He has been drinking coffee early in the morning and again at 7 PM.  He recently stopped the evening coffee and symptoms improved.  Patient denies discharge urgency or dysuria.  Not active sexually since he saw his wife back in February in [place].  No family history of prostate issues. History reviewed. No pertinent past medical history.  History reviewed. No pertinent surgical history.  Family History  Problem Relation Age of Onset  . Diabetes Father   . Heart disease Father     Social History   Socioeconomic History  . Marital status: Married    Spouse name: Not on file  . Number of children: Not on file  . Years of education: Not on file  . Highest education level: Not on file  Occupational History  . Not on file  Social Needs  . Financial resource strain: Not on file  . Food insecurity    Worry: Not on file    Inability: Not on file  . Transportation needs    Medical: Not on file    Non-medical: Not on file  Tobacco Use  . Smoking status: Never Smoker  . Smokeless tobacco: Never Used  Substance and Sexual Activity  . Alcohol use: Never    Frequency: Never  . Drug use: Never  . Sexual activity: Not on file  Lifestyle  . Physical activity    Days per week: Not on file    Minutes per session: Not on file  . Stress: Not on file  Relationships  . Social Musicianconnections    Talks on phone: Not on file    Gets together: Not on file    Attends religious service: Not on file    Active member of club or organization: Not on file    Attends meetings of clubs or organizations: Not  on file    Relationship status: Not on file  . Intimate partner violence    Fear of current or ex partner: Not on file    Emotionally abused: Not on file    Physically abused: Not on file    Forced sexual activity: Not on file  Other Topics Concern  . Not on file  Social History Narrative  . Not on file    Outpatient Medications Prior to Visit  Medication Sig Dispense Refill  . beclomethasone (QVAR REDIHALER) 80 MCG/ACT inhaler Inhale 2 puffs into the lungs twice daily 10.6 g 5  . montelukast (SINGULAIR) 10 MG tablet Take 1 tablet (10 mg total) by mouth at bedtime. 30 tablet 5  . psyllium (METAMUCIL SMOOTH TEXTURE) 58.6 % powder Dissolve one packet in 8oz of water up to 3 times daily as needed for constipation. 283 g 12   No facility-administered medications prior to visit.     No Known Allergies  ROS Review of Systems  Constitutional: Negative for chills, diaphoresis, fatigue, fever and unexpected weight change.  HENT: Negative.   Respiratory: Negative.   Cardiovascular: Negative.   Gastrointestinal: Negative for abdominal pain, anal bleeding, blood in stool, constipation, nausea and vomiting.  Endocrine: Negative for polyphagia  and polyuria.  Genitourinary: Positive for frequency. Negative for discharge, dysuria, hematuria and urgency.  Musculoskeletal: Negative.   Allergic/Immunologic: Negative for immunocompromised state.  Neurological: Negative.   Hematological: Negative.   Psychiatric/Behavioral: Negative.       Objective:    Physical Exam  Constitutional: He is oriented to person, place, and time. He appears well-developed and well-nourished. No distress.  HENT:  Head: Normocephalic and atraumatic.  Right Ear: External ear normal.  Left Ear: External ear normal.  Mouth/Throat: Oropharynx is clear and moist. No oropharyngeal exudate.  Eyes: Pupils are equal, round, and reactive to light. Conjunctivae are normal. Right eye exhibits no discharge. Left eye  exhibits no discharge. No scleral icterus.  Neck: No JVD present. No tracheal deviation present.  Cardiovascular: Normal rate, regular rhythm and normal heart sounds.  Pulmonary/Chest: Effort normal and breath sounds normal. No stridor.  Abdominal: Bowel sounds are normal. He exhibits no distension. There is no abdominal tenderness. There is no rebound and no guarding. Hernia confirmed negative in the right inguinal area and confirmed negative in the left inguinal area.  Genitourinary: Right testis shows no mass, no swelling and no tenderness. Right testis is descended. Left testis shows no mass, no swelling and no tenderness. Left testis is descended. Circumcised. No hypospadias, penile erythema or penile tenderness. No discharge found.  Lymphadenopathy:       Right: No inguinal adenopathy present.       Left: No inguinal adenopathy present.  Neurological: He is alert and oriented to person, place, and time.  Skin: Skin is warm and dry. He is not diaphoretic.  Psychiatric: He has a normal mood and affect. His behavior is normal.    BP 110/70   Pulse 73   Ht 5\' 8"  (1.727 m)   SpO2 97%   BMI 26.46 kg/m  Wt Readings from Last 3 Encounters:  09/09/18 174 lb (78.9 kg)  09/04/18 176 lb 2 oz (79.9 kg)  08/05/18 172 lb (78 kg)   BP Readings from Last 3 Encounters:  09/25/18 110/70  09/09/18 116/70  09/04/18 110/70   Guideline developer:  UpToDate (see UpToDate for funding source) Date Released: June 2014  Health Maintenance Due  Topic Date Due  . HIV Screening  05/19/2001  . TETANUS/TDAP  05/19/2005    There are no preventive care reminders to display for this patient.  Lab Results  Component Value Date   TSH 2.24 08/05/2018   Lab Results  Component Value Date   WBC 4.3 08/05/2018   HGB 15.0 08/05/2018   HCT 42.1 08/05/2018   MCV 84.6 08/05/2018   PLT 165.0 08/05/2018   Lab Results  Component Value Date   NA 140 08/05/2018   K 4.3 08/05/2018   CO2 28 08/05/2018    GLUCOSE 96 08/05/2018   BUN 11 08/05/2018   CREATININE 0.91 08/05/2018   BILITOT 0.6 08/05/2018   ALKPHOS 64 08/05/2018   AST 24 08/05/2018   ALT 25 08/05/2018   PROT 7.0 08/05/2018   ALBUMIN 4.7 08/05/2018   CALCIUM 9.5 08/05/2018   GFR 96.42 08/05/2018   Lab Results  Component Value Date   CHOL 156 08/05/2018   Lab Results  Component Value Date   HDL 45.60 08/05/2018   Lab Results  Component Value Date   LDLCALC 92 08/05/2018   Lab Results  Component Value Date   TRIG 95.0 08/05/2018   Lab Results  Component Value Date   CHOLHDL 3 08/05/2018   No results found for:  HGBA1C    Assessment & Plan:   Problem List Items Addressed This Visit      Other   Incomplete emptying of bladder   Relevant Orders   Urine cytology ancillary only   US Abdomen Limited   HIV antibody (with reflex)   Nocturia   Relevant Orders   Basic metabolic panel   Urinalysis, Routine w reflex microscopic   Urine cytology ancillary only   US Abdomen Limited   HIV antibody (with reflex)   Urinary frequency - Primary   Relevant Orders   Basic metabolic panel   Urinalysis, Routine w reflex microscopic   Urine cytology ancillary only   US Abdomen Limited   HIV antibody (with reflex)      No orders of the defined types were placed in this encounter.   Follow-up: Return in about 2 weeks (around 10/09/2018).    Patient will hold off fluids 2 hours before retiring and restrict caffeine usage to only in the early morning hours.  Follow-up in 2 weeks.

## 2018-09-26 LAB — URINALYSIS, ROUTINE W REFLEX MICROSCOPIC
Bilirubin, UA: NEGATIVE
Glucose, UA: NEGATIVE
Ketones, UA: NEGATIVE
Leukocytes,UA: NEGATIVE
Nitrite, UA: NEGATIVE
Protein,UA: NEGATIVE
RBC, UA: NEGATIVE
Specific Gravity, UA: 1.006 (ref 1.005–1.030)
Urobilinogen, Ur: 0.2 mg/dL (ref 0.2–1.0)
pH, UA: 5.5 (ref 5.0–7.5)

## 2018-09-26 LAB — BASIC METABOLIC PANEL
BUN/Creatinine Ratio: 13 (ref 9–20)
BUN: 12 mg/dL (ref 6–20)
CO2: 21 mmol/L (ref 20–29)
Calcium: 9.7 mg/dL (ref 8.7–10.2)
Chloride: 104 mmol/L (ref 96–106)
Creatinine, Ser: 0.93 mg/dL (ref 0.76–1.27)
GFR calc Af Amer: 125 mL/min/{1.73_m2} (ref >59)
GFR calc non Af Amer: 108 mL/min/{1.73_m2} (ref >59)
Glucose: 88 mg/dL (ref 65–99)
Potassium: 4 mmol/L (ref 3.5–5.2)
Sodium: 141 mmol/L (ref 134–144)

## 2018-09-26 LAB — HIV ANTIBODY (ROUTINE TESTING W REFLEX): HIV 1&2 Ab, 4th Generation: NONREACTIVE

## 2018-09-30 ENCOUNTER — Ambulatory Visit (HOSPITAL_BASED_OUTPATIENT_CLINIC_OR_DEPARTMENT_OTHER): Payer: BC Managed Care – PPO

## 2018-09-30 LAB — URINE CYTOLOGY ANCILLARY ONLY
Chlamydia: NEGATIVE
Neisseria Gonorrhea: NEGATIVE

## 2018-10-02 ENCOUNTER — Encounter: Payer: Self-pay | Admitting: Family Medicine

## 2018-10-06 ENCOUNTER — Encounter: Payer: Self-pay | Admitting: Family Medicine

## 2018-10-08 ENCOUNTER — Ambulatory Visit (HOSPITAL_BASED_OUTPATIENT_CLINIC_OR_DEPARTMENT_OTHER): Payer: BC Managed Care – PPO

## 2018-10-10 ENCOUNTER — Encounter: Payer: Self-pay | Admitting: Family Medicine

## 2018-10-31 ENCOUNTER — Encounter: Payer: Self-pay | Admitting: Family Medicine

## 2018-11-06 ENCOUNTER — Other Ambulatory Visit: Payer: Self-pay

## 2018-11-06 ENCOUNTER — Ambulatory Visit (INDEPENDENT_AMBULATORY_CARE_PROVIDER_SITE_OTHER): Payer: BC Managed Care – PPO | Admitting: Family Medicine

## 2018-11-06 ENCOUNTER — Encounter: Payer: Self-pay | Admitting: Family Medicine

## 2018-11-06 VITALS — BP 110/70 | HR 80 | Ht 68.0 in

## 2018-11-06 DIAGNOSIS — N3281 Overactive bladder: Secondary | ICD-10-CM | POA: Diagnosis not present

## 2018-11-06 MED ORDER — TOLTERODINE TARTRATE ER 4 MG PO CP24: 4.0000 mg | ORAL_CAPSULE | Freq: Every day | ORAL | 2 refills | Status: DC

## 2018-11-06 NOTE — Progress Notes (Signed)
Established Patient Office Visit  Subjective:  Patient ID: Patrick Parsons, male    DOB: 09/25/1985  Age: 33 y.o. MRN: [id number]  CC:  Chief Complaint  Patient presents with  . Follow-up    HPI Patrick BongKamal Sze presents for follow-up of his urinary frequency.  Patient has decreased the amount of caffeine consumed and limited to the morning hours only.  He has tried restricting fluid 2 to 3 hours before bedtime.  He still has urinary frequency with a feeling of incomplete emptying on a daily basis.  He is experiencing nocturia x2-3 nightly.  There is been no fever chills abdominal pain nausea or vomiting.  No dysuria.  He does not drink alcohol.  He is not sexually active outside of his marriage.  His wife is unremarkable.  History reviewed. No pertinent past medical history.  History reviewed. No pertinent surgical history.  Family History  Problem Relation Age of Onset  . Diabetes Father   . Heart disease Father     Social History   Socioeconomic History  . Marital status: Married    Spouse name: Not on file  . Number of children: Not on file  . Years of education: Not on file  . Highest education level: Not on file  Occupational History  . Not on file  Social Needs  . Financial resource strain: Not on file  . Food insecurity    Worry: Not on file    Inability: Not on file  . Transportation needs    Medical: Not on file    Non-medical: Not on file  Tobacco Use  . Smoking status: Never Smoker  . Smokeless tobacco: Never Used  Substance and Sexual Activity  . Alcohol use: Never    Frequency: Never  . Drug use: Never  . Sexual activity: Not on file  Lifestyle  . Physical activity    Days per week: Not on file    Minutes per session: Not on file  . Stress: Not on file  Relationships  . Social Musicianconnections    Talks on phone: Not on file    Gets together: Not on file    Attends religious service: Not on file    Active member of club or organization: Not on file   Attends meetings of clubs or organizations: Not on file    Relationship status: Not on file  . Intimate partner violence    Fear of current or ex partner: Not on file    Emotionally abused: Not on file    Physically abused: Not on file    Forced sexual activity: Not on file  Other Topics Concern  . Not on file  Social History Narrative  . Not on file    Outpatient Medications Prior to Visit  Medication Sig Dispense Refill  . beclomethasone (QVAR REDIHALER) 80 MCG/ACT inhaler Inhale 2 puffs into the lungs twice daily 10.6 g 5  . montelukast (SINGULAIR) 10 MG tablet Take 1 tablet (10 mg total) by mouth at bedtime. 30 tablet 5  . psyllium (METAMUCIL SMOOTH TEXTURE) 58.6 % powder Dissolve one packet in 8oz of water up to 3 times daily as needed for constipation. 283 g 12   No facility-administered medications prior to visit.     No Known Allergies  ROS Review of Systems  Constitutional: Negative for chills, diaphoresis, fatigue, fever and unexpected weight change.  Respiratory: Negative.   Cardiovascular: Negative.   Gastrointestinal: Negative.   Endocrine: Negative for polyphagia and polyuria.  Genitourinary:  Positive for frequency and urgency. Negative for decreased urine volume, difficulty urinating, discharge and dysuria.  Musculoskeletal: Negative for arthralgias and myalgias.  Skin: Negative for pallor and rash.  Allergic/Immunologic: Negative for immunocompromised state.  Neurological: Negative for light-headedness and numbness.  Hematological: Does not bruise/bleed easily.  Psychiatric/Behavioral: Negative.       Objective:    Physical Exam  Constitutional: He is oriented to person, place, and time. He appears well-developed and well-nourished. No distress.  HENT:  Head: Normocephalic and atraumatic.  Right Ear: External ear normal.  Left Ear: External ear normal.  Eyes: Conjunctivae are normal. Right eye exhibits no discharge. Left eye exhibits no discharge. No  scleral icterus.  Neck: No JVD present. No tracheal deviation present.  Pulmonary/Chest: Effort normal. No stridor.  Genitourinary: Rectum:     Guaiac result negative.     No rectal mass, anal fissure, tenderness, external hemorrhoid, internal hemorrhoid or abnormal anal tone.  Prostate is not enlarged and not tender.  Neurological: He is alert and oriented to person, place, and time.  Skin: Skin is warm and dry. He is not diaphoretic.  Psychiatric: He has a normal mood and affect. His behavior is normal.    BP 110/70   Pulse 80   Ht 5\' 8"  (1.727 m)   SpO2 97%   BMI 26.46 kg/m  Wt Readings from Last 3 Encounters:  09/09/18 174 lb (78.9 kg)  09/04/18 176 lb 2 oz (79.9 kg)  08/05/18 172 lb (78 kg)   BP Readings from Last 3 Encounters:  11/06/18 110/70  09/25/18 110/70  09/09/18 116/70   Guideline developer:  UpToDate (see UpToDate for funding source) Date Released: June 2014  Health Maintenance Due  Topic Date Due  . TETANUS/TDAP  05/19/2005  . INFLUENZA VACCINE  10/11/2018    There are no preventive care reminders to display for this patient.  Lab Results  Component Value Date   TSH 2.24 08/05/2018   Lab Results  Component Value Date   WBC 4.3 08/05/2018   HGB 15.0 08/05/2018   HCT 42.1 08/05/2018   MCV 84.6 08/05/2018   PLT 165.0 08/05/2018   Lab Results  Component Value Date   NA 141 09/25/2018   K 4.0 09/25/2018   CO2 21 09/25/2018   GLUCOSE 88 09/25/2018   BUN 12 09/25/2018   CREATININE 0.93 09/25/2018   BILITOT 0.6 08/05/2018   ALKPHOS 64 08/05/2018   AST 24 08/05/2018   ALT 25 08/05/2018   PROT 7.0 08/05/2018   ALBUMIN 4.7 08/05/2018   CALCIUM 9.7 09/25/2018   GFR 96.42 08/05/2018   Lab Results  Component Value Date   CHOL 156 08/05/2018   Lab Results  Component Value Date   HDL 45.60 08/05/2018   Lab Results  Component Value Date   LDLCALC 92 08/05/2018   Lab Results  Component Value Date   TRIG 95.0 08/05/2018   Lab Results   Component Value Date   CHOLHDL 3 08/05/2018   No results found for: HGBA1C    Assessment & Plan:   Problem List Items Addressed This Visit    None    Visit Diagnoses    OAB (overactive bladder)    -  Primary   Relevant Medications   tolterodine (DETROL LA) 4 MG 24 hr capsule      Meds ordered this encounter  Medications  . tolterodine (DETROL LA) 4 MG 24 hr capsule    Sig: Take 1 capsule (4 mg total) by mouth  daily.    Dispense:  30 capsule    Refill:  2    Follow-up: Return in about 1 month (around 12/07/2018).   Pt. Was given info on overactive bladder syndrome and detrol la.

## 2018-11-06 NOTE — Patient Instructions (Signed)

## 2018-12-03 ENCOUNTER — Ambulatory Visit: Payer: BC Managed Care – PPO

## 2018-12-11 ENCOUNTER — Ambulatory Visit: Payer: BC Managed Care – PPO | Admitting: Allergy & Immunology

## 2018-12-16 ENCOUNTER — Encounter: Payer: Self-pay | Admitting: Allergy & Immunology

## 2018-12-16 ENCOUNTER — Ambulatory Visit (INDEPENDENT_AMBULATORY_CARE_PROVIDER_SITE_OTHER): Payer: BC Managed Care – PPO | Admitting: Allergy & Immunology

## 2018-12-16 ENCOUNTER — Telehealth: Payer: Self-pay | Admitting: *Deleted

## 2018-12-16 ENCOUNTER — Other Ambulatory Visit: Payer: Self-pay

## 2018-12-16 VITALS — BP 112/66 | HR 72 | Temp 98.4°F | Resp 16

## 2018-12-16 DIAGNOSIS — J3089 Other allergic rhinitis: Secondary | ICD-10-CM

## 2018-12-16 DIAGNOSIS — J302 Other seasonal allergic rhinitis: Secondary | ICD-10-CM | POA: Diagnosis not present

## 2018-12-16 DIAGNOSIS — J4530 Mild persistent asthma, uncomplicated: Secondary | ICD-10-CM | POA: Diagnosis not present

## 2018-12-16 DIAGNOSIS — H6591 Unspecified nonsuppurative otitis media, right ear: Secondary | ICD-10-CM | POA: Diagnosis not present

## 2018-12-16 DIAGNOSIS — H7291 Unspecified perforation of tympanic membrane, right ear: Secondary | ICD-10-CM

## 2018-12-16 MED ORDER — ALBUTEROL SULFATE HFA 108 (90 BASE) MCG/ACT IN AERS: 2.0000 | INHALATION_SPRAY | Freq: Four times a day (QID) | RESPIRATORY_TRACT | 1 refills | Status: DC | PRN

## 2018-12-16 MED ORDER — QVAR REDIHALER 80 MCG/ACT IN AERB: 1.0000 | INHALATION_SPRAY | Freq: Every day | RESPIRATORY_TRACT | 5 refills | Status: DC

## 2018-12-16 NOTE — Telephone Encounter (Signed)
Please refer patient to ENT for Right ear drum rupture per Dr. Ernst Bowler.

## 2018-12-16 NOTE — Patient Instructions (Addendum)
1. Mild persistent asthma, uncomplicated - Lung testing looks great today.  - We are going to send in a RESCUE inhaler (albuterol) to use when you have these shortness of breath episodes.  - Stop the Singulair (montelukast).  - Daily controller medication(s): Qvar 85mcg Redihaler one puff once daily  - Prior to physical activity: albuterol 2 puffs 10-15 minutes before physical activity. - Rescue medications: albuterol 4 puffs every 4-6 hours as needed    - Asthma control goals:  * Full participation in all desired activities (may need albuterol before activity) * Albuterol use two time or less a week on average (not counting use with activity) * Cough interfering with sleep two time or less a month * Oral steroids no more than once a year * No hospitalizations  2. Seasonal allergic rhinitis due to pollen (grasses, weeds, trees and indoor molds) - Continue with: Nasacort one spray per nostril daily (especially on the days when you will be outdoors and exposed to grass pollen)  3. Right tympanic membrane (ear drum) rupture - We will refer you to an ENT doctor. - They should call you in 1-2 weeks (likely by the end of this week).  4. Return in about 3 months (around 03/18/2019). This can be an in-person, a virtual Webex or a telephone follow up visit.   Please inform us of any Emergency Department visits, hospitalizations, or changes in symptoms. Call us before going to the ED for breathing or allergy symptoms since we might be able to fit you in for a sick visit. Feel free to contact us anytime with any questions, problems, or concerns.  It was a pleasure to see you again today!  Websites that have reliable patient information: 1. American Academy of Asthma, Allergy, and Immunology: www.aaaai.org 2. Food Allergy Research and Education (FARE): foodallergy.org 3. Mothers of Asthmatics: http://www.asthmacommunitynetwork.org 4. American College of Allergy, Asthma, and Immunology:  www.acaai.org  "Like" [place] on Facebook and Instagram for our latest updates!      Make sure you are registered to vote! If you have moved or changed any of your contact information, you will need to get this updated before voting!  In some cases, you MAY be able to register to vote online: [email or web address]    Voter ID laws are NOT going into effect for the General Election in November 2020! DO NOT let this stop you from exercising your right to vote!   Absentee voting is the SAFEST way to vote during the coronavirus pandemic!   Download and print an absentee ballot request form at [email or web address] or you can scan the QR code below with your smart phone:      More information on absentee ballots can be found here: [email or web address]

## 2018-12-16 NOTE — Progress Notes (Addendum)
FOLLOW UP  Date of Service/Encounter:  12/16/18   Assessment:   Mild persistent asthma, uncomplicated   Seasonal and perennial allergic rhinitis (grasses, weeds, trees and indoor molds)  Right tympanic membrane rupture  Plan/Recommendations:   1. Mild persistent asthma, uncomplicated - Lung testing looks great today.  - We are going to send in a RESCUE inhaler (albuterol) to use when you have these shortness of breath episodes.  - Stop the Singulair (montelukast).  - Daily controller medication(s): Qvar 35mcg Redihaler one puff once daily - Prior to physical activity: albuterol 2 puffs 10-15 minutes before physical activity. - Rescue medications: albuterol 4 puffs every 4-6 hours as needed - Asthma control goals:  * Full participation in all desired activities (may need albuterol before activity) * Albuterol use two time or less a week on average (not counting use with activity) * Cough interfering with sleep two time or less a month * Oral steroids no more than once a year * No hospitalizations  2. Seasonal allergic rhinitis due to pollen (grasses, weeds, trees and indoor molds) - Continue with: Nasacort one spray per nostril daily (especially on the days when you will be outdoors and exposed to grass pollen)  3. Right tympanic membrane (ear drum) rupture - We will refer you to an ENT doctor. - They should call you in 1-2 weeks (likely by the end of this week).  4. Return in about 3 months (around 03/18/2019). This can be an in-person, a virtual Webex or a telephone follow up visit.   Subjective:   Patrick Parsons is a 33 y.o. male presenting today for follow up of  Chief Complaint  Patient presents with   Shortness of Breath   Asthma    Igor Bishop has a history of the following: Patient Active Problem List   Diagnosis Date Noted   Incomplete emptying of bladder 09/25/2018   Nocturia 09/25/2018   Urinary frequency 09/25/2018   Mild persistent asthma,  uncomplicated 36/14/4315   Mild reactive airways disease 09/04/2018   Seasonal allergic rhinitis due to pollen 09/04/2018   Encounter for health maintenance examination with abnormal findings 08/05/2018   Constipation by delayed colonic transit 08/05/2018   Right lower quadrant abdominal pain 08/05/2018    History obtained from: chart review and patient.  Patrick Parsons is a 33 y.o. male presenting for a follow up visit.  He was last seen in June 2020 as a new patient.  At that time, his spirometry is technically normal but it did improve with the albuterol.  I felt that he would benefit from the addition of an inhaled steroid, so he started Qvar 80 mcg 2 puffs twice daily.  We also continued with Singulair 10 mg daily.  He had testing that was positive to grasses, weeds, trees, and indoor molds.  We continued the Singulair and started Zyrtec as well as Nasacort.  Since the last visit, he reports that he has had some episodes of shortness of breath that have awoken him from sleep. This started a couple of nights ago. He has not had any fevers with these episodes. He used a few puffs of Qvar to help and he was able to go back to sleep.   Asthma/Respiratory Symptom History: He has been using his the Qvar only when he mows the lawn or when hre does not feel well.  Farrel's asthma has been well controlled. He has not required rescue medication, experienced nocturnal awakenings due to lower respiratory symptoms, nor have activities of  daily living been limited. He has required no Emergency Department or Urgent Care visits for his asthma. He has required zero courses of systemic steroids for asthma exacerbations since the last visit. ACT score today is 16, indicating excellent asthma symptom control.   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 It seems that he has done some reading and he does not want to do any  "permanent damage" to his body. 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 It is not entirely clear, but regardless he declines to use the montelukast again. He does not want to use "any antihistamines".   Otherwise, there have been no changes to his past medical history, surgical history, family history, or social history.    Review of Systems  Constitutional: Negative.  Negative for chills, fever, malaise/fatigue and weight loss.  HENT: Negative.  Negative for congestion, ear discharge, ear pain and sore throat.   Eyes: Negative for pain, discharge and redness.  Respiratory: Positive for shortness of breath. Negative for cough, sputum production and wheezing.   Cardiovascular: Negative.  Negative for chest pain and palpitations.  Gastrointestinal: Negative for abdominal pain, constipation, diarrhea, heartburn, nausea and vomiting.  Skin: Negative.  Negative for itching and rash.  Neurological: Negative for dizziness and headaches.  Endo/Heme/Allergies: Negative for environmental allergies. Does not bruise/bleed easily.       Objective:   Blood pressure 112/66, pulse 72, temperature 98.4 F (36.9 C), temperature source Temporal, resp. rate 16, SpO2 96 %. There is no height or weight on file to calculate BMI.   Physical Exam:  Physical Exam  Constitutional: He appears well-developed.  HENT:  Head: Normocephalic and atraumatic.  Right Ear: External ear and ear canal normal. No drainage, swelling or tenderness. Tympanic membrane is not injected, not scarred, not erythematous, not retracted and not bulging.  Left Ear: Tympanic membrane, external ear and ear canal normal. No drainage, swelling or tenderness. Tympanic membrane is not injected, not scarred, not erythematous, not retracted and not bulging.  Nose: Mucosal edema and rhinorrhea present. No nasal deformity or septal deviation. No epistaxis. Right sinus exhibits no maxillary sinus tenderness and no frontal sinus  tenderness. Left sinus exhibits no maxillary sinus tenderness and no frontal sinus tenderness.  Mouth/Throat: Uvula is midline and oropharynx is clear and moist. Mucous membranes are not pale and not dry.  Some cobblestoning present in the posterior oropharynx. Tonsils 2+ bilaterally without discharge. Hole within the 10:00 position of the right TM without discharge.  Eyes: Pupils are equal, round, and reactive to light. Conjunctivae and EOM are normal. Right eye exhibits no chemosis and no discharge. Left eye exhibits no chemosis and no discharge. Right conjunctiva is not injected. Left conjunctiva is not injected.  Cardiovascular: Normal rate, regular rhythm and normal heart sounds.  Respiratory: Effort normal and breath sounds normal. No accessory muscle usage. No tachypnea. No respiratory distress. He has no wheezes. He has no rhonchi. He has no rales. He exhibits no tenderness.  Moving air well in all lung fields. No crackles or wheezes noted. He does seem to be having some difficulty with taking a deep breath.   GI: There is no abdominal tenderness. There is no rebound and no guarding.  Lymphadenopathy:       Head (right side): No submandibular, no tonsillar and no occipital adenopathy present.       Head (left side): No submandibular, no tonsillar and no  occipital adenopathy present.    He has no cervical adenopathy.  Neurological: He is alert.  Skin: No abrasion, no petechiae and no rash noted. Rash is not papular, not vesicular and not urticarial. No erythema. No pallor.  No urticarial or eczematous lesions noted.   Psychiatric: He has a normal mood and affect.     Diagnostic studies:    Spirometry: results normal (FEV1: 3.97/103%, FVC: 4.69/101%, FEV1/FVC: 85%).    Spirometry consistent with normal pattern.   Allergy Studies: none      Malachi BondsJoel Aneyah Lortz, MD  [facility]

## 2018-12-17 NOTE — Addendum Note (Signed)
Addended by: Valentina Shaggy on: 12/17/2018 03:41 PM   Modules accepted: Orders

## 2018-12-22 NOTE — Telephone Encounter (Signed)
Thank you much, Dyasia!   Salvatore Marvel, MD [facility]

## 2018-12-22 NOTE — Telephone Encounter (Signed)
Referral placed to Dr Velvet Bathe office in [place].  They will call the patient to schedule.   Thanks

## 2018-12-29 ENCOUNTER — Encounter: Payer: Self-pay | Admitting: Allergy & Immunology

## 2018-12-29 ENCOUNTER — Telehealth: Payer: Self-pay | Admitting: *Deleted

## 2018-12-29 NOTE — Telephone Encounter (Signed)
Please advise 

## 2018-12-30 ENCOUNTER — Encounter: Payer: Self-pay | Admitting: Family Medicine

## 2018-12-30 NOTE — Telephone Encounter (Signed)
Message has been sent back to patient via [place]. Currently waiting for response.

## 2018-12-30 NOTE — Telephone Encounter (Signed)
I would try to clarify the inhalers that he is using. We recommended using the Qvar one puff once daily instead of just before mowing the lawn. We also recommended using the albuterol 2-4 puffs every 4-6 hours as needed. Is he still have shortness of breath with this?   I would ask whether he is white spots in his mouth. Maybe has had developed thrush with the daily use of the Qvar.   Salvatore Marvel, MD [facility]

## 2019-01-01 ENCOUNTER — Encounter: Payer: Self-pay | Admitting: Family Medicine

## 2019-01-01 ENCOUNTER — Ambulatory Visit (INDEPENDENT_AMBULATORY_CARE_PROVIDER_SITE_OTHER): Payer: BC Managed Care – PPO | Admitting: Family Medicine

## 2019-01-01 ENCOUNTER — Other Ambulatory Visit: Payer: Self-pay

## 2019-01-01 VITALS — BP 110/70 | HR 66 | Temp 97.7°F | Ht 68.0 in | Wt 163.0 lb

## 2019-01-01 DIAGNOSIS — F418 Other specified anxiety disorders: Secondary | ICD-10-CM | POA: Insufficient documentation

## 2019-01-01 DIAGNOSIS — R0600 Dyspnea, unspecified: Secondary | ICD-10-CM | POA: Diagnosis not present

## 2019-01-01 DIAGNOSIS — H00012 Hordeolum externum right lower eyelid: Secondary | ICD-10-CM | POA: Diagnosis not present

## 2019-01-01 MED ORDER — PAROXETINE HCL 10 MG PO TABS: ORAL_TABLET | ORAL | 0 refills | Status: DC

## 2019-01-01 NOTE — Progress Notes (Signed)
Established Patient Office Visit  Subjective:  Patient ID: Patrick Parsons, male    DOB: 1985-03-24  Age: 33 y.o. MRN: [id number]  CC:  Chief Complaint  Patient presents with  . Follow-up    HPI Patrick Parsons presents for evaluation of nocturnal dyspnea.  Patient awakes at night gasping for air.  He lives alone and as far as he knows he does not snore.  He has been experiencing fatigue.  He feels as though his asthma was better before he started the inhalers.  Patient has a tender swelling in his right lower eyelid.  He gets this once or twice a year.  Patient does live alone and his family is back in [place] including his wife.  He does admit to feeling sad and blue at times.  Past Medical History:  Diagnosis Date  . Asthma     History reviewed. No pertinent surgical history.  Family History  Problem Relation Age of Onset  . Diabetes Father   . Heart disease Father   . Allergic rhinitis Mother     Social History   Socioeconomic History  . Marital status: Married    Spouse name: Not on file  . Number of children: Not on file  . Years of education: Not on file  . Highest education level: Not on file  Occupational History  . Not on file  Social Needs  . Financial resource strain: Not on file  . Food insecurity    Worry: Not on file    Inability: Not on file  . Transportation needs    Medical: Not on file    Non-medical: Not on file  Tobacco Use  . Smoking status: Never Smoker  . Smokeless tobacco: Never Used  Substance and Sexual Activity  . Alcohol use: Never    Frequency: Never  . Drug use: Never  . Sexual activity: Not on file  Lifestyle  . Physical activity    Days per week: Not on file    Minutes per session: Not on file  . Stress: Not on file  Relationships  . Social Musician on phone: Not on file    Gets together: Not on file    Attends religious service: Not on file    Active member of club or organization: Not on file    Attends  meetings of clubs or organizations: Not on file    Relationship status: Not on file  . Intimate partner violence    Fear of current or ex partner: Not on file    Emotionally abused: Not on file    Physically abused: Not on file    Forced sexual activity: Not on file  Other Topics Concern  . Not on file  Social History Narrative  . Not on file    Outpatient Medications Prior to Visit  Medication Sig Dispense Refill  . albuterol (VENTOLIN HFA) 108 (90 Base) MCG/ACT inhaler Inhale 2 puffs into the lungs every 6 (six) hours as needed for wheezing or shortness of breath. 18 g 1  . beclomethasone (QVAR REDIHALER) 80 MCG/ACT inhaler Inhale 2 puffs into the lungs twice daily 10.6 g 5  . montelukast (SINGULAIR) 10 MG tablet Take 1 tablet (10 mg total) by mouth at bedtime. (Patient not taking: Reported on 12/16/2018) 30 tablet 5  . psyllium (METAMUCIL SMOOTH TEXTURE) 58.6 % powder Dissolve one packet in 8oz of water up to 3 times daily as needed for constipation. (Patient not taking: Reported on 12/16/2018)  283 g 12  . tolterodine (DETROL LA) 4 MG 24 hr capsule Take 1 capsule (4 mg total) by mouth daily. (Patient not taking: Reported on 12/16/2018) 30 capsule 2  . beclomethasone (QVAR REDIHALER) 80 MCG/ACT inhaler Inhale 1 puff into the lungs daily. 10.6 g 5   No facility-administered medications prior to visit.     No Known Allergies  ROS Review of Systems  Constitutional: Negative.   HENT: Negative.   Eyes: Negative for photophobia, pain, redness and visual disturbance.  Respiratory: Positive for shortness of breath. Negative for chest tightness and wheezing.   Cardiovascular: Negative.   Gastrointestinal: Negative.   Endocrine: Negative for polyphagia and polyuria.  Genitourinary: Negative.   Musculoskeletal: Negative.   Skin: Negative.   Allergic/Immunologic: Negative for immunocompromised state.  Neurological: Negative for light-headedness and headaches.  Hematological: Does not  bruise/bleed easily.  Psychiatric/Behavioral: Positive for dysphoric mood. The patient is nervous/anxious.    Depression screen PHQ 2/9 01/01/2019  Decreased Interest 2  Down, Depressed, Hopeless 3  PHQ - 2 Score 5  Altered sleeping 0  Tired, decreased energy 2  Change in appetite 3  Feeling bad or failure about yourself  1  Trouble concentrating 2  Moving slowly or fidgety/restless 2  Suicidal thoughts 0  PHQ-9 Score 15  Difficult doing work/chores Somewhat difficult      Objective:    Physical Exam  Constitutional: He is oriented to person, place, and time. He appears well-developed and well-nourished. No distress.  HENT:  Head: Normocephalic and atraumatic.  Right Ear: External ear normal.  Left Ear: External ear normal.  Mouth/Throat:    Eyes: Conjunctivae are normal. Right eye exhibits no discharge. Left eye exhibits no discharge. No scleral icterus.    Neck: No JVD present. No tracheal deviation present.  Pulmonary/Chest: Effort normal. No stridor.  Abdominal: Bowel sounds are normal.  Neurological: He is alert and oriented to person, place, and time.  Skin: Skin is warm and dry. He is not diaphoretic.  Psychiatric: He has a normal mood and affect. His behavior is normal.    BP 110/70   Pulse 66   Temp 97.7 F (36.5 C) (Tympanic)   Ht 5\' 8"  (1.727 m)   Wt 163 lb (73.9 kg)   SpO2 96%   BMI 24.78 kg/m  Wt Readings from Last 3 Encounters:  01/01/19 163 lb (73.9 kg)  09/09/18 174 lb (78.9 kg)  09/04/18 176 lb 2 oz (79.9 kg)   BP Readings from Last 3 Encounters:  01/01/19 110/70  12/16/18 112/66  11/06/18 110/70   Guideline developer:  UpToDate (see UpToDate for funding source) Date Released: June 2014  Health Maintenance Due  Topic Date Due  . TETANUS/TDAP  05/19/2005    There are no preventive care reminders to display for this patient.  Lab Results  Component Value Date   TSH 2.24 08/05/2018   Lab Results  Component Value Date   WBC 4.3  08/05/2018   HGB 15.0 08/05/2018   HCT 42.1 08/05/2018   MCV 84.6 08/05/2018   PLT 165.0 08/05/2018   Lab Results  Component Value Date   NA 141 09/25/2018   K 4.0 09/25/2018   CO2 21 09/25/2018   GLUCOSE 88 09/25/2018   BUN 12 09/25/2018   CREATININE 0.93 09/25/2018   BILITOT 0.6 08/05/2018   ALKPHOS 64 08/05/2018   AST 24 08/05/2018   ALT 25 08/05/2018   PROT 7.0 08/05/2018   ALBUMIN 4.7 08/05/2018   CALCIUM 9.7  09/25/2018   GFR 96.42 08/05/2018   Lab Results  Component Value Date   CHOL 156 08/05/2018   Lab Results  Component Value Date   HDL 45.60 08/05/2018   Lab Results  Component Value Date   LDLCALC 92 08/05/2018   Lab Results  Component Value Date   TRIG 95.0 08/05/2018   Lab Results  Component Value Date   CHOLHDL 3 08/05/2018   No results found for: HGBA1C    Assessment & Plan:   Problem List Items Addressed This Visit      Other   Hordeolum externum of right lower eyelid   Depression with anxiety - Primary   Relevant Medications   PARoxetine (PAXIL) 10 MG tablet    Other Visit Diagnoses    Nocturnal dyspnea       Relevant Orders   Ambulatory referral to Pulmonology      Meds ordered this encounter  Medications  . PARoxetine (PAXIL) 10 MG tablet    Sig: Take one tablet daily for one week and then take 2 daily.    Dispense:  60 tablet    Refill:  0    Follow-up: Return in about 1 month (around 02/01/2019).      Patient agrees to get Paxil a chance.  He will start out with 1 pill once daily for a week and then increase to 2 pills daily.  He will adjust time taken to what works best for him.  He is doing information on hordeolum and will use warm compresses as directed.  Follow-up if it does not improve on its own.  He requests a referral to a different pulmonologist.

## 2019-01-01 NOTE — Patient Instructions (Signed)

## 2019-01-23 ENCOUNTER — Encounter: Payer: Self-pay | Admitting: Family Medicine

## 2019-01-23 ENCOUNTER — Other Ambulatory Visit: Payer: Self-pay

## 2019-01-23 ENCOUNTER — Ambulatory Visit (INDEPENDENT_AMBULATORY_CARE_PROVIDER_SITE_OTHER): Payer: BC Managed Care – PPO | Admitting: Family Medicine

## 2019-01-23 VITALS — BP 120/70 | HR 82 | Ht 68.0 in

## 2019-01-23 DIAGNOSIS — F418 Other specified anxiety disorders: Secondary | ICD-10-CM | POA: Diagnosis not present

## 2019-01-23 DIAGNOSIS — Z9119 Patient's noncompliance with other medical treatment and regimen: Secondary | ICD-10-CM

## 2019-01-23 DIAGNOSIS — S39012A Strain of muscle, fascia and tendon of lower back, initial encounter: Secondary | ICD-10-CM | POA: Insufficient documentation

## 2019-01-23 DIAGNOSIS — Z91199 Patient's noncompliance with other medical treatment and regimen due to unspecified reason: Secondary | ICD-10-CM | POA: Insufficient documentation

## 2019-01-23 MED ORDER — METHOCARBAMOL 500 MG PO TABS: 500.0000 mg | ORAL_TABLET | Freq: Three times a day (TID) | ORAL | 0 refills | Status: DC | PRN

## 2019-01-23 NOTE — Patient Instructions (Signed)
Back Injury Prevention Back injuries can be very painful. They can also be difficult to heal. After having one back injury, you are more likely to have another one again. It is important to learn how to avoid injuring or re-injuring your back. The following tips can help you to prevent a back injury. What actions can I take to prevent back injuries? Nutrition changes Talk with your health care provider about your overall diet, and especially about foods that strengthen your bones.  Ask your health care provider how much calcium and vitamin D you need each day. These nutrients help to prevent weakening of the bones (osteoporosis). Osteoporosis can cause broken (fractured) bones, which lead to back pain.  Eat foods that are good sources of calcium. These include dairy products, green leafy vegetables, and products that have had calcium added to them (fortified).  Eat foods that are good sources of vitamin D. These include milk and foods that are fortified with vitamin D.  If needed, take supplements and vitamins as directed by your health care provider. Physical fitness Physical fitness strengthens your bones and your muscles. It also increases your balance and strength.  Exercise for 30 minutes per day on most days of the week, or as directed by your health care provider. Make sure to: ? Do aerobic exercises, such as walking, jogging, biking, or swimming. ? Do exercises that increase balance and strength, such as tai chi and yoga. These can decrease your risk of falling and injuring your back. ? Do stretching exercises to help with flexibility. ? Develop strong abdominal muscles. Your abdominal muscles provide a lot of the support that your back needs.  Maintain a healthy weight. This helps to decrease your risk of a back injury. Good posture        Prevent back injuries by developing and maintaining a good posture. To do this successfully:  Sit up and stand up straight. Avoid leaning  forward when you sit or hunching over when you stand.  Choose chairs that have good low-back (lumbar) support.  If you work at a desk, sit close to it so you do not need to lean over. Keep your chin tucked in. Keep your neck drawn back, and keep your elbows bent at a right angle.  Sit high and close to the steering wheel when you drive. Add a lumbar support to your car seat, if needed.  Avoid sitting or standing in one position for very long. Take breaks to get up, stretch, and walk around at least one time every hour. Take breaks every hour if you are driving for long periods of time.  Sleep on your side with your knees slightly bent, or sleep on your back with a pillow under your knees.  Lifting, twisting, and reaching Back injuries are more likely to occur when carrying loads and twisting at the same time. When you bend and lift, or reach for items that are high up in shelves, use positions that put less stress on your back.  Heavy lifting ? Avoid heavy lifting, especially the kind of heavy lifting that is repetitive. If you must do heavy lifting: ? Stretch before lifting. ? Work slowly. ? Rest between lifts. ? Use a tool such as a cart or a dolly to move objects. ? Make several small trips instead of carrying one heavy load. ? Ask for help when you need it, especially when moving big or heavy objects. ? Follow these steps when lifting: ? Stand with your feet  shoulder-width apart. ? Get as close to the object as you can. Do not try to pick up a heavy object that is far from your body. ? Use handles or lifting straps if they are available. ? Bend at your knees. Squat down, but keep your heels off the floor. ? Keep your shoulders pulled back, your chin tucked in, and your back straight. ? Lift the object slowly while you tighten the muscles in your legs, abdomen, and buttocks. Keep the object as close to the center of your body as possible. ? Follow these steps when putting down a  heavy load: ? Stand with your feet shoulder-width apart. ? Lower the object slowly while you tighten the muscles in your legs, abdomen, and buttocks. Keep the object as close to the center of your body as possible. ? Keep your shoulders pulled back, your chin tucked in, and your back straight. ? Bend at your knees. Squat down, but keep your heels off the floor. ? Use handles or lifting straps if they are available.  Twisting and reaching ? Avoid lifting heavy objects above your waist. ? Do not twist at your waist while you are lifting or carrying a load. If you need to turn, move your feet. ? Do not bend over without bending at your knees. ? Avoid reaching over your head, across a table, or for an object on a high surface.  Other changes   Avoid wet floors and icy ground. Keep sidewalks clear of ice to prevent falls.  Do not sleep on a mattress that is too soft or too hard.  Put heavier objects on shelves at waist level, and put lighter objects on lower or higher shelves.  Find ways to decrease your stress, such as by exercising, getting a massage, or practicing relaxation techniques. Stress can build up in your muscles. Tense muscles are more vulnerable to injury.  Talk with your health care provider if you feel anxious or depressed. These conditions can make back pain worse.  Wear flat heel shoes with cushioned soles.  Use both shoulder straps when carrying a backpack.  Do not use any products that contain nicotine or tobacco, such as cigarettes and e-cigarettes. If you need help quitting, ask your health care provider. Summary  Back injuries can be very painful and difficult to heal.  You can prevent injuring or re-injuring your back by making nutrition changes, working on being physically fit, developing a good posture, and lifting heavy objects in a safe way.  Making other changes can also help to prevent back injuries. These include eating a healthy diet, exercising  regularly and maintaining a healthy weight. This information is not intended to replace advice given to you by your health care provider. Make sure you discuss any questions you have with your health care provider. Document Released: 04/05/2004 Document Revised: 04/13/2017 Document Reviewed: 04/13/2017 Elsevier Patient Education  2020 [place].  Lumbosacral Strain Lumbosacral strain is an injury that causes pain in the lower back (lumbosacral spine). This injury usually occurs from overstretching the muscles or ligaments along your spine. A strain can affect one or more muscles or cord-like tissues that connect bones to other bones (ligaments). What are the causes? This condition may be caused by:  A hard, direct hit (blow) to the back.  Excessive stretching of the lower back muscles. This may result from: ? A fall. ? Lifting something heavy. ? Repetitive movements such as bending or crouching. What increases the risk? The following factors  may increase your risk of getting this condition:  Participating in sports or activities that involve: ? A sudden twist of the back. ? Pushing or pulling motions.  Being overweight or obese.  Having poor strength and flexibility, especially tight hamstrings or weak muscles in the back or abdomen.  Having too much of a curve in the lower back.  Having a pelvis that is tilted forward. What are the signs or symptoms? The main symptom of this condition is pain in the lower back, at the site of the strain. Pain may extend (radiate) down one or both legs. How is this diagnosed? This condition is diagnosed based on:  Your symptoms.  Your medical history.  A physical exam. ? Your health care provider may push on certain areas of your back to determine the source of your pain. ? You may be asked to bend forward, backward, and side to side to assess the severity of your pain and your range of motion.  Imaging tests, such as: ? X-rays. ?  MRI.  How is this treated? Treatment for this condition may include:  Putting heat and cold on the affected area.  Medicines to help relieve pain and relax your muscles (muscle relaxants).  NSAIDs to help reduce swelling and discomfort. When your symptoms improve, it is important to gradually return to your normal routine as soon as possible to reduce pain, avoid stiffness, and avoid loss of muscle strength. Generally, symptoms should improve within 6 weeks of treatment. However, recovery time varies. Follow these instructions at home: Managing pain, stiffness, and swelling   If directed, put ice on the injured area during the first 24 hours after your strain. ? Put ice in a plastic bag. ? Place a towel between your skin and the bag. ? Leave the ice on for 20 minutes, 2-3 times a day.  If directed, put heat on the affected area as often as told by your health care provider. Use the heat source that your health care provider recommends, such as a moist heat pack or a heating pad. ? Place a towel between your skin and the heat source. ? Leave the heat on for 20-30 minutes. ? Remove the heat if your skin turns bright red. This is especially important if you are unable to feel pain, heat, or cold. You may have a greater risk of getting burned. Activity  Rest and return to your normal activities as told by your health care provider. Ask your health care provider what activities are safe for you.  Avoid activities that take a lot of energy for as long as told by your health care provider. General instructions  Take over-the-counter and prescription medicines only as told by your health care provider.  Donot drive or use heavy machinery while taking prescription pain medicine.  Do not use any products that contain nicotine or tobacco, such as cigarettes and e-cigarettes. If you need help quitting, ask your health care provider.  Keep all follow-up visits as told by your health care  provider. This is important. How is this prevented?  Use correct form when playing sports and lifting heavy objects.  Use good posture when sitting and standing.  Maintain a healthy weight.  Sleep on a mattress with medium firmness to support your back.  Be safe and responsible while being active to avoid falls.  Do at least 150 minutes of moderate-intensity exercise each week, such as brisk walking or water aerobics. Try a form of exercise that takes stress  off your back, such as swimming or stationary cycling.  Maintain physical fitness, including: ? Strength. ? Flexibility. ? Cardiovascular fitness. ? Endurance. Contact a health care provider if:  Your back pain does not improve after 6 weeks of treatment.  Your symptoms get worse. Get help right away if:  Your back pain is severe.  You cannot stand or walk.  You have difficulty controlling when you urinate or when you have a bowel movement.  You feel nauseous or you vomit.  Your feet get very cold.  You have numbness, tingling, weakness, or problems using your arms or legs.  You develop any of the following: ? Shortness of breath. ? Dizziness. ? Pain in your legs. ? Weakness in your buttocks or legs. ? Discoloration of the skin on your toes or legs. This information is not intended to replace advice given to you by your health care provider. Make sure you discuss any questions you have with your health care provider. Document Released: 12/06/2004 Document Revised: 06/20/2018 Document Reviewed: 07/31/2015 Elsevier Patient Education  2020 [employer].

## 2019-01-23 NOTE — Progress Notes (Signed)
Established Patient Office Visit  Subjective:  Patient ID: Patrick Parsons, male    DOB: Aug 16, 1985  Age: 33 y.o. MRN: [id number]  CC:  Chief Complaint  Patient presents with  . Back Pain    HPI Patrick BongKamal Krejci presents for evaluation and treatment of a 6-day history of lower back pain.  Pain is nonradiating.  Denies weakness or paresthesias in his lower extremities.  There has been no change in his bowel or bladder function.  Range of motion is limited and painful.  Continues to work at home.  He engages in no heavy lifting.  He has tried no medicines for this.  He decided not to start the Paxil because he was concerned about side effects listed for the medication.  Particularly he was concerned that it said that there would be sensitization and after a while the drug will not be effective for him.  Past Medical History:  Diagnosis Date  . Asthma     History reviewed. No pertinent surgical history.  Family History  Problem Relation Age of Onset  . Diabetes Father   . Heart disease Father   . Allergic rhinitis Mother     Social History   Socioeconomic History  . Marital status: Married    Spouse name: Not on file  . Number of children: Not on file  . Years of education: Not on file  . Highest education level: Not on file  Occupational History  . Not on file  Social Needs  . Financial resource strain: Not on file  . Food insecurity    Worry: Not on file    Inability: Not on file  . Transportation needs    Medical: Not on file    Non-medical: Not on file  Tobacco Use  . Smoking status: Never Smoker  . Smokeless tobacco: Never Used  Substance and Sexual Activity  . Alcohol use: Never    Frequency: Never  . Drug use: Never  . Sexual activity: Not on file  Lifestyle  . Physical activity    Days per week: Not on file    Minutes per session: Not on file  . Stress: Not on file  Relationships  . Social Musicianconnections    Talks on phone: Not on file    Gets together: Not  on file    Attends religious service: Not on file    Active member of club or organization: Not on file    Attends meetings of clubs or organizations: Not on file    Relationship status: Not on file  . Intimate partner violence    Fear of current or ex partner: Not on file    Emotionally abused: Not on file    Physically abused: Not on file    Forced sexual activity: Not on file  Other Topics Concern  . Not on file  Social History Narrative  . Not on file    Outpatient Medications Prior to Visit  Medication Sig Dispense Refill  . albuterol (VENTOLIN HFA) 108 (90 Base) MCG/ACT inhaler Inhale 2 puffs into the lungs every 6 (six) hours as needed for wheezing or shortness of breath. 18 g 1  . beclomethasone (QVAR REDIHALER) 80 MCG/ACT inhaler Inhale 2 puffs into the lungs twice daily 10.6 g 5  . PARoxetine (PAXIL) 10 MG tablet Take one tablet daily for one week and then take 2 daily. 60 tablet 0  . montelukast (SINGULAIR) 10 MG tablet Take 1 tablet (10 mg total) by mouth at bedtime. (  Patient not taking: Reported on 12/16/2018) 30 tablet 5  . psyllium (METAMUCIL SMOOTH TEXTURE) 58.6 % powder Dissolve one packet in 8oz of water up to 3 times daily as needed for constipation. (Patient not taking: Reported on 12/16/2018) 283 g 12  . tolterodine (DETROL LA) 4 MG 24 hr capsule Take 1 capsule (4 mg total) by mouth daily. (Patient not taking: Reported on 12/16/2018) 30 capsule 2   No facility-administered medications prior to visit.     No Known Allergies  ROS Review of Systems  Constitutional: Negative.   HENT: Negative.   Eyes: Negative for photophobia and visual disturbance.  Respiratory: Negative.   Cardiovascular: Negative.   Gastrointestinal: Negative.   Endocrine: Negative for polyphagia and polyuria.  Musculoskeletal: Positive for back pain and myalgias.  Skin: Negative for pallor and rash.  Allergic/Immunologic: Negative for immunocompromised state.  Neurological: Negative for  weakness and numbness.  Hematological: Does not bruise/bleed easily.  Psychiatric/Behavioral: Negative.       Objective:    Physical Exam  Constitutional: He is oriented to person, place, and time. He appears well-developed and well-nourished. No distress.  HENT:  Head: Normocephalic and atraumatic.  Right Ear: External ear normal.  Left Ear: External ear normal.  Eyes: Conjunctivae are normal. Right eye exhibits no discharge. Left eye exhibits no discharge. No scleral icterus.  Neck: No JVD present. No tracheal deviation present.  Pulmonary/Chest: Effort normal. No stridor.  Musculoskeletal:     Lumbar back: He exhibits decreased range of motion, tenderness and bony tenderness.  Neurological: He is alert and oriented to person, place, and time. He has normal strength.  Reflex Scores:      Patellar reflexes are 2+ on the right side and 2+ on the left side.      Achilles reflexes are 1+ on the right side and 1+ on the left side. Negative dural tension signs.   Skin: Skin is warm and dry. He is not diaphoretic.  Psychiatric: He has a normal mood and affect. His behavior is normal.    BP 120/70   Pulse 82   Ht 5\' 8"  (1.727 m)   SpO2 96%   BMI 24.78 kg/m  Wt Readings from Last 3 Encounters:  01/01/19 163 lb (73.9 kg)  09/09/18 174 lb (78.9 kg)  09/04/18 176 lb 2 oz (79.9 kg)   BP Readings from Last 3 Encounters:  01/23/19 120/70  01/01/19 110/70  12/16/18 112/66   Guideline developer:  UpToDate (see UpToDate for funding source) Date Released: June 2014  Health Maintenance Due  Topic Date Due  . TETANUS/TDAP  05/19/2005    There are no preventive care reminders to display for this patient.  Lab Results  Component Value Date   TSH 2.24 08/05/2018   Lab Results  Component Value Date   WBC 4.3 08/05/2018   HGB 15.0 08/05/2018   HCT 42.1 08/05/2018   MCV 84.6 08/05/2018   PLT 165.0 08/05/2018   Lab Results  Component Value Date   NA 141 09/25/2018   K 4.0  09/25/2018   CO2 21 09/25/2018   GLUCOSE 88 09/25/2018   BUN 12 09/25/2018   CREATININE 0.93 09/25/2018   BILITOT 0.6 08/05/2018   ALKPHOS 64 08/05/2018   AST 24 08/05/2018   ALT 25 08/05/2018   PROT 7.0 08/05/2018   ALBUMIN 4.7 08/05/2018   CALCIUM 9.7 09/25/2018   GFR 96.42 08/05/2018   Lab Results  Component Value Date   CHOL 156 08/05/2018   Lab  Results  Component Value Date   HDL 45.60 08/05/2018   Lab Results  Component Value Date   LDLCALC 92 08/05/2018   Lab Results  Component Value Date   TRIG 95.0 08/05/2018   Lab Results  Component Value Date   CHOLHDL 3 08/05/2018   No results found for: HGBA1C    Assessment & Plan:   Problem List Items Addressed This Visit      Musculoskeletal and Integument   Strain of lumbar region   Relevant Medications   methocarbamol (ROBAXIN) 500 MG tablet     Other   Depression with anxiety - Primary   Non-compliant patient      Meds ordered this encounter  Medications  . methocarbamol (ROBAXIN) 500 MG tablet    Sig: Take 1 tablet (500 mg total) by mouth every 8 (eight) hours as needed for muscle spasms.    Dispense:  40 tablet    Refill:  0    Follow-up: Return in about 1 month (around 02/22/2019), or please take tylenol with the prescription muscle relaxer.   Strongly encouraged the patient to start the Paxil.  Advised him to use Tylenol with above ordered Robaxin as needed for his back pain.  He was given information on preventing lower back pain injuries.

## 2019-01-28 ENCOUNTER — Other Ambulatory Visit: Payer: Self-pay

## 2019-01-28 DIAGNOSIS — H00019 Hordeolum externum unspecified eye, unspecified eyelid: Secondary | ICD-10-CM | POA: Diagnosis not present

## 2019-01-28 DIAGNOSIS — Z20822 Contact with and (suspected) exposure to covid-19: Secondary | ICD-10-CM

## 2019-01-28 DIAGNOSIS — Z20828 Contact with and (suspected) exposure to other viral communicable diseases: Secondary | ICD-10-CM | POA: Diagnosis not present

## 2019-01-30 LAB — NOVEL CORONAVIRUS, NAA: SARS-CoV-2, NAA: NOT DETECTED

## 2019-02-10 ENCOUNTER — Institutional Professional Consult (permissible substitution): Payer: BC Managed Care – PPO | Admitting: Critical Care Medicine

## 2019-03-24 ENCOUNTER — Ambulatory Visit: Payer: BC Managed Care – PPO | Admitting: Allergy & Immunology

## 2019-05-07 ENCOUNTER — Telehealth: Payer: Self-pay | Admitting: Family Medicine

## 2019-05-07 NOTE — Telephone Encounter (Signed)
error 

## 2019-09-08 ENCOUNTER — Telehealth: Payer: Self-pay | Admitting: Family Medicine

## 2019-09-08 NOTE — Telephone Encounter (Signed)
Received MyChart request that patient wanted to schedule his Annual Physical. Called twice and recording said that call could not be completed as dialed. If patient calls he just needs to schedule his physical.

## 2019-09-15 ENCOUNTER — Ambulatory Visit: Payer: Self-pay

## 2019-12-28 ENCOUNTER — Ambulatory Visit: Payer: Self-pay | Admitting: Family Medicine

## 2020-04-15 ENCOUNTER — Encounter: Payer: Self-pay | Admitting: Family Medicine

## 2020-04-15 ENCOUNTER — Ambulatory Visit (INDEPENDENT_AMBULATORY_CARE_PROVIDER_SITE_OTHER): Payer: Commercial Managed Care - PPO

## 2020-04-15 ENCOUNTER — Ambulatory Visit: Payer: Commercial Managed Care - PPO | Admitting: Family Medicine

## 2020-04-15 ENCOUNTER — Ambulatory Visit: Payer: Self-pay | Admitting: Family Medicine

## 2020-04-15 ENCOUNTER — Other Ambulatory Visit: Payer: Self-pay

## 2020-04-15 VITALS — BP 114/66 | HR 70 | Temp 98.7°F | Ht 68.0 in | Wt 169.8 lb

## 2020-04-15 DIAGNOSIS — M549 Dorsalgia, unspecified: Secondary | ICD-10-CM

## 2020-04-15 DIAGNOSIS — G8929 Other chronic pain: Secondary | ICD-10-CM | POA: Diagnosis not present

## 2020-04-15 DIAGNOSIS — Z833 Family history of diabetes mellitus: Secondary | ICD-10-CM | POA: Diagnosis not present

## 2020-04-15 DIAGNOSIS — Z1159 Encounter for screening for other viral diseases: Secondary | ICD-10-CM

## 2020-04-15 DIAGNOSIS — Z0000 Encounter for general adult medical examination without abnormal findings: Secondary | ICD-10-CM

## 2020-04-15 DIAGNOSIS — M545 Low back pain, unspecified: Secondary | ICD-10-CM

## 2020-04-15 DIAGNOSIS — Z13220 Encounter for screening for lipoid disorders: Secondary | ICD-10-CM | POA: Diagnosis not present

## 2020-04-15 LAB — BASIC METABOLIC PANEL
BUN: 17 mg/dL (ref 6–23)
CO2: 28 mEq/L (ref 19–32)
Calcium: 10 mg/dL (ref 8.4–10.5)
Chloride: 104 mEq/L (ref 96–112)
Creatinine, Ser: 0.86 mg/dL (ref 0.40–1.50)
GFR: 113.45 mL/min (ref >60.00)
Glucose, Bld: 89 mg/dL (ref 70–99)
Potassium: 4 mEq/L (ref 3.5–5.1)
Sodium: 139 mEq/L (ref 135–145)

## 2020-04-15 LAB — HEMOGLOBIN A1C: Hgb A1c MFr Bld: 5.4 % (ref 4.6–6.5)

## 2020-04-15 LAB — LIPID PANEL
Cholesterol: 193 mg/dL (ref 0–200)
HDL: 63.5 mg/dL (ref >39.00)
LDL Cholesterol: 109 mg/dL — ABNORMAL HIGH (ref 0–99)
NonHDL: 129.22
Total CHOL/HDL Ratio: 3
Triglycerides: 101 mg/dL (ref 0.0–149.0)
VLDL: 20.2 mg/dL (ref 0.0–40.0)

## 2020-04-15 LAB — ALT: ALT: 22 U/L (ref 0–53)

## 2020-04-15 LAB — AST: AST: 21 U/L (ref 0–37)

## 2020-04-15 MED ORDER — IBUPROFEN 600 MG PO TABS: 600.0000 mg | ORAL_TABLET | Freq: Two times a day (BID) | ORAL | 0 refills | Status: DC | PRN

## 2020-04-15 NOTE — Progress Notes (Signed)
Patrick Parsons is a 35 y.o. male  Chief Complaint  Patient presents with  . Acute Visit    C/o having pain in lower back x year off/on.  Also wants to get blood work done.      HPI: Patrick Parsons is a 35 y.o. male patient of Dr. Doreene Burke who complains of intermittent low back pain x 1 year. He feels like it worse/more noticeable in the winter. More noticeable/bothersome when he is sitting for a  period of time. Pain, at times, can limit his motion. No radiation of pain and no numbness or tingling.  He does not take anything for the pain. He did use 2-3 times a topical cream with some relief. He uses a lumbar support pillow in car and at his desk. Pt both sits and stands at work.  Pt was seen by PCP in 01/2019 for 1 wk h/o LBP and was dx with lumbar strain. Pt was encouraged to do regular back exercises and take robaxin PRN.   He also requests routine screening labs and is overdue for CPE. He is not fasting today but pt wants to do labs and he will RTO for CPE.  Past Medical History:  Diagnosis Date  . Asthma     History reviewed. No pertinent surgical history.  Social History   Socioeconomic History  . Marital status: Married    Spouse name: Not on file  . Number of children: Not on file  . Years of education: Not on file  . Highest education level: Not on file  Occupational History  . Not on file  Tobacco Use  . Smoking status: Never Smoker  . Smokeless tobacco: Never Used  Vaping Use  . Vaping Use: Never used  Substance and Sexual Activity  . Alcohol use: Never  . Drug use: Never  . Sexual activity: Not on file  Other Topics Concern  . Not on file  Social History Narrative  . Not on file   Social Determinants of Health   Financial Resource Strain: Not on file  Food Insecurity: Not on file  Transportation Needs: Not on file  Physical Activity: Not on file  Stress: Not on file  Social Connections: Not on file  Intimate Partner Violence: Not on file    Family  History  Problem Relation Age of Onset  . Diabetes Father   . Heart disease Father   . Allergic rhinitis Mother      Immunization History  Administered Date(s) Administered  . PFIZER(Purple Top)SARS-COV-2 Vaccination 08/25/2019, 09/22/2019    Outpatient Encounter Medications as of 04/15/2020  Medication Sig  . [DISCONTINUED] albuterol (VENTOLIN HFA) 108 (90 Base) MCG/ACT inhaler Inhale 2 puffs into the lungs every 6 (six) hours as needed for wheezing or shortness of breath. (Patient not taking: Reported on 04/15/2020)  . [DISCONTINUED] beclomethasone (QVAR REDIHALER) 80 MCG/ACT inhaler Inhale 2 puffs into the lungs twice daily (Patient not taking: Reported on 04/15/2020)  . [DISCONTINUED] methocarbamol (ROBAXIN) 500 MG tablet Take 1 tablet (500 mg total) by mouth every 8 (eight) hours as needed for muscle spasms. (Patient not taking: Reported on 04/15/2020)  . [DISCONTINUED] PARoxetine (PAXIL) 10 MG tablet Take one tablet daily for one week and then take 2 daily. (Patient not taking: Reported on 04/15/2020)   No facility-administered encounter medications on file as of 04/15/2020.     ROS: Pertinent positives and negatives noted in HPI. Remainder of ROS non-contributory   No Known Allergies  BP 114/66   Pulse  70   Temp 98.7 F (37.1 C) (Temporal)   Ht 5\' 8"  (1.727 m)   Wt 169 lb 12.8 oz (77 kg)   BMI 25.82 kg/m    Wt Readings from Last 3 Encounters:  04/15/20 169 lb 12.8 oz (77 kg)  01/01/19 163 lb (73.9 kg)  09/09/18 174 lb (78.9 kg)   Temp Readings from Last 3 Encounters:  04/15/20 98.7 F (37.1 C) (Temporal)  01/01/19 97.7 F (36.5 C) (Tympanic)  12/16/18 98.4 F (36.9 C) (Temporal)   BP Readings from Last 3 Encounters:  04/15/20 114/66  01/23/19 120/70  01/01/19 110/70   Pulse Readings from Last 3 Encounters:  04/15/20 70  01/23/19 82  01/01/19 66     Physical Exam Constitutional:      General: He is not in acute distress.    Appearance: Normal appearance. He  is normal weight.  Musculoskeletal:     Lumbar back: No swelling, deformity, spasms, tenderness or bony tenderness. Normal range of motion (pt does note discomfort at endpoint of forward flexion). Negative right straight leg raise test and negative left straight leg raise test.  Neurological:     General: No focal deficit present.     Mental Status: He is alert and oriented to person, place, and time.     Gait: Gait normal.  Psychiatric:        Mood and Affect: Mood normal.        Behavior: Behavior normal.      A/P:  1. Musculoskeletal back pain 2. Chronic midline low back pain without sciatica - on/off x 1 year, no red flag symptoms - heat BID followed by stretching exercises - included in AVS Rx: - ibuprofen (ADVIL) 600 MG tablet; Take 1 tablet (600 mg total) by mouth 2 (two) times daily as needed.  Dispense: 60 tablet; Refill: 0 - pt to take BID w/ food x 5-7 days then PRN - DG Lumbar Spine Complete - can f/u on progress at CPE appt  3. Annual physical exam - pt overdue for CPE, would like to do labs today despite not being fasting and then RTO for CPE appt - CBC - Basic metabolic panel - AST - ALT  4. Family history of diabetes mellitus in father - Hemoglobin A1c  5. Screening for lipid disorders - Lipid panel  6. Encounter for hepatitis C screening test for low risk patient - Hepatitis C Antibody    This visit occurred during the SARS-CoV-2 public health emergency.  Safety protocols were in place, including screening questions prior to the visit, additional usage of staff PPE, and extensive cleaning of exam room while observing appropriate contact time as indicated for disinfecting solutions.

## 2020-04-15 NOTE — Patient Instructions (Addendum)
Use heating pad 2x/day for 15-20 min on then off After heating pad do stretching exercises - do at least once per day, 2 times is fine - see below Take ibuprofen 600mg  twice per day with food x 5-7 days then as needed - Rx sent to your pharmacy  Continue to use lumbar support pillows and padded chair at work Xray done today  Low Back Sprain or Strain Rehab Ask your health care provider which exercises are safe for you. Do exercises exactly as told by your health care provider and adjust them as directed. It is normal to feel mild stretching, pulling, tightness, or discomfort as you do these exercises. Stop right away if you feel sudden pain or your pain gets worse. Do not begin these exercises until told by your health care provider. Stretching and range-of-motion exercises These exercises warm up your muscles and joints and improve the movement and flexibility of your back. These exercises also help to relieve pain, numbness, and tingling. Lumbar rotation 1. Lie on your back on a firm surface and bend your knees. 2. Straighten your arms out to your sides so each arm forms a 90-degree angle (right angle) with a side of your body. 3. Slowly move (rotate) both of your knees to one side of your body until you feel a stretch in your lower back (lumbar). Try not to let your shoulders lift off the floor. 4. Hold this position for __________ seconds. 5. Tense your abdominal muscles and slowly move your knees back to the starting position. 6. Repeat this exercise on the other side of your body. Repeat __________ times. Complete this exercise __________ times a day.   Single knee to chest 1. Lie on your back on a firm surface with both legs straight. 2. Bend one of your knees. Use your hands to move your knee up toward your chest until you feel a gentle stretch in your lower back and buttock. ? Hold your leg in this position by holding on to the front of your knee. ? Keep your other leg as straight as  possible. 3. Hold this position for __________ seconds. 4. Slowly return to the starting position. 5. Repeat with your other leg. Repeat __________ times. Complete this exercise __________ times a day.   Prone extension on elbows 1. Lie on your abdomen on a firm surface (prone position). 2. Prop yourself up on your elbows. 3. Use your arms to help lift your chest up until you feel a gentle stretch in your abdomen and your lower back. ? This will place some of your body weight on your elbows. If this is uncomfortable, try stacking pillows under your chest. ? Your hips should stay down, against the surface that you are lying on. Keep your hip and back muscles relaxed. 4. Hold this position for __________ seconds. 5. Slowly relax your upper body and return to the starting position. Repeat __________ times. Complete this exercise __________ times a day.   Strengthening exercises These exercises build strength and endurance in your back. Endurance is the ability to use your muscles for a long time, even after they get tired. Pelvic tilt This exercise strengthens the muscles that lie deep in the abdomen. 1. Lie on your back on a firm surface. Bend your knees and keep your feet flat on the floor. 2. Tense your abdominal muscles. Tip your pelvis up toward the ceiling and flatten your lower back into the floor. ? To help with this exercise, you may place  a small towel under your lower back and try to push your back into the towel. 3. Hold this position for __________ seconds. 4. Let your muscles relax completely before you repeat this exercise. Repeat __________ times. Complete this exercise __________ times a day. Alternating arm and leg raises 1. Get on your hands and knees on a firm surface. If you are on a hard floor, you may want to use padding, such as an exercise mat, to cushion your knees. 2. Line up your arms and legs. Your hands should be directly below your shoulders, and your knees  should be directly below your hips. 3. Lift your left leg behind you. At the same time, raise your right arm and straighten it in front of you. ? Do not lift your leg higher than your hip. ? Do not lift your arm higher than your shoulder. ? Keep your abdominal and back muscles tight. ? Keep your hips facing the ground. ? Do not arch your back. ? Keep your balance carefully, and do not hold your breath. 4. Hold this position for __________ seconds. 5. Slowly return to the starting position. 6. Repeat with your right leg and your left arm. Repeat __________ times. Complete this exercise __________ times a day.   Abdominal set with straight leg raise 1. Lie on your back on a firm surface. 2. Bend one of your knees and keep your other leg straight. 3. Tense your abdominal muscles and lift your straight leg up, 4-6 inches (10-15 cm) off the ground. 4. Keep your abdominal muscles tight and hold this position for __________ seconds. ? Do not hold your breath. ? Do not arch your back. Keep it flat against the ground. 5. Keep your abdominal muscles tense as you slowly lower your leg back to the starting position. 6. Repeat with your other leg. Repeat __________ times. Complete this exercise __________ times a day.   Single leg lower with bent knees 1. Lie on your back on a firm surface. 2. Tense your abdominal muscles and lift your feet off the floor, one foot at a time, so your knees and hips are bent in 90-degree angles (right angles). ? Your knees should be over your hips and your lower legs should be parallel to the floor. 3. Keeping your abdominal muscles tense and your knee bent, slowly lower one of your legs so your toe touches the ground. 4. Lift your leg back up to return to the starting position. ? Do not hold your breath. ? Do not let your back arch. Keep your back flat against the ground. 5. Repeat with your other leg. Repeat __________ times. Complete this exercise __________ times  a day. Posture and body mechanics Good posture and healthy body mechanics can help to relieve stress in your body's tissues and joints. Body mechanics refers to the movements and positions of your body while you do your daily activities. Posture is part of body mechanics. Good posture means:  Your spine is in its natural S-curve position (neutral).  Your shoulders are pulled back slightly.  Your head is not tipped forward. Follow these guidelines to improve your posture and body mechanics in your everyday activities. Standing  When standing, keep your spine neutral and your feet about hip width apart. Keep a slight bend in your knees. Your ears, shoulders, and hips should line up.  When you do a task in which you stand in one place for a long time, place one foot up on a stable  object that is 2-4 inches (5-10 cm) high, such as a footstool. This helps keep your spine neutral.   Sitting  When sitting, keep your spine neutral and keep your feet flat on the floor. Use a footrest, if necessary, and keep your thighs parallel to the floor. Avoid rounding your shoulders, and avoid tilting your head forward.  When working at a desk or a computer, keep your desk at a height where your hands are slightly lower than your elbows. Slide your chair under your desk so you are close enough to maintain good posture.  When working at a computer, place your monitor at a height where you are looking straight ahead and you do not have to tilt your head forward or downward to look at the screen.   Resting  When lying down and resting, avoid positions that are most painful for you.  If you have pain with activities such as sitting, bending, stooping, or squatting, lie in a position in which your body does not bend very much. For example, avoid curling up on your side with your arms and knees near your chest (fetal position).  If you have pain with activities such as standing for a long time or reaching with your  arms, lie with your spine in a neutral position and bend your knees slightly. Try the following positions: ? Lying on your side with a pillow between your knees. ? Lying on your back with a pillow under your knees. Lifting  When lifting objects, keep your feet at least shoulder width apart and tighten your abdominal muscles.  Bend your knees and hips and keep your spine neutral. It is important to lift using the strength of your legs, not your back. Do not lock your knees straight out.  Always ask for help to lift heavy or awkward objects.   This information is not intended to replace advice given to you by your health care provider. Make sure you discuss any questions you have with your health care provider. Document Revised: 06/20/2018 Document Reviewed: 03/20/2018 Elsevier Patient Education  2021 [employer].

## 2020-04-18 LAB — HEPATITIS C ANTIBODY
Hepatitis C Ab: NONREACTIVE
SIGNAL TO CUT-OFF: 0 (ref <1.00)

## 2020-05-14 ENCOUNTER — Other Ambulatory Visit: Payer: Self-pay | Admitting: Family Medicine

## 2020-05-14 DIAGNOSIS — G8929 Other chronic pain: Secondary | ICD-10-CM

## 2020-05-14 DIAGNOSIS — M549 Dorsalgia, unspecified: Secondary | ICD-10-CM

## 2020-05-14 DIAGNOSIS — M545 Low back pain, unspecified: Secondary | ICD-10-CM

## 2020-05-16 NOTE — Telephone Encounter (Signed)
Last Ov 04/15/20 Last fill 04/15/20 #60/0

## 2020-07-11 ENCOUNTER — Encounter: Payer: Commercial Managed Care - PPO | Admitting: Family Medicine

## 2020-07-17 ENCOUNTER — Other Ambulatory Visit: Payer: Self-pay | Admitting: Allergy & Immunology

## 2020-09-02 ENCOUNTER — Other Ambulatory Visit (INDEPENDENT_AMBULATORY_CARE_PROVIDER_SITE_OTHER): Payer: Commercial Managed Care - PPO

## 2020-09-02 ENCOUNTER — Other Ambulatory Visit: Payer: Self-pay

## 2020-09-02 ENCOUNTER — Ambulatory Visit: Payer: Commercial Managed Care - PPO | Admitting: Family Medicine

## 2020-09-02 ENCOUNTER — Encounter: Payer: Self-pay | Admitting: Family Medicine

## 2020-09-02 VITALS — BP 104/66 | HR 69 | Temp 97.8°F | Ht 68.0 in | Wt 186.0 lb

## 2020-09-02 DIAGNOSIS — M2550 Pain in unspecified joint: Secondary | ICD-10-CM

## 2020-09-02 DIAGNOSIS — N3289 Other specified disorders of bladder: Secondary | ICD-10-CM | POA: Insufficient documentation

## 2020-09-02 DIAGNOSIS — M791 Myalgia, unspecified site: Secondary | ICD-10-CM | POA: Insufficient documentation

## 2020-09-02 LAB — URINALYSIS, ROUTINE W REFLEX MICROSCOPIC
Bilirubin Urine: NEGATIVE
Hgb urine dipstick: NEGATIVE
Ketones, ur: NEGATIVE
Leukocytes,Ua: NEGATIVE
Nitrite: NEGATIVE
RBC / HPF: NONE SEEN (ref 0–?)
Specific Gravity, Urine: 1.025 (ref 1.000–1.030)
Total Protein, Urine: NEGATIVE
Urine Glucose: NEGATIVE
Urobilinogen, UA: 0.2 (ref 0.0–1.0)
pH: 5.5 (ref 5.0–8.0)

## 2020-09-02 LAB — SEDIMENTATION RATE: Sed Rate: 8 mm/hr (ref 0–15)

## 2020-09-02 MED ORDER — OXYBUTYNIN CHLORIDE ER 5 MG PO TB24
5.0000 mg | ORAL_TABLET | Freq: Every day | ORAL | 1 refills | Status: DC
Start: 2020-09-02 — End: 2020-10-28

## 2020-09-02 MED ORDER — MELOXICAM 7.5 MG PO TABS: 7.5000 mg | ORAL_TABLET | Freq: Every day | ORAL | 1 refills | Status: DC

## 2020-09-02 NOTE — Progress Notes (Signed)
Established Patient Office Visit  Subjective:  Patient ID: Patrick Parsons, male    DOB: [birth year]  Age: 35 y.o. MRN: [id number]  CC:  Chief Complaint  Patient presents with   Extremity Weakness    Weakness in leg some pains symptoms x 4 weeks.     HPI Patrick Parsons presents for evaluation of a 2 to 3-week history of muscle and joint pains in his lower extremities.  There has been no injury.  Denies ongoing back pain.  His back does bother him when he sits for long period of time such as driving for more than 1 hour.  Denies swelling or stiffness in any of his joints.  Denies recent sickness fever or rash.  Ongoing nocturia.  He awakens to urinate up to 8 times nightly but usually 3 times.  He does consume a lot of caffeine throughout the day and into the evening.  He does consume fluids up until bedtime.  Force of stream is good.  There is occasional urgency in the evening.  Denies pre or post void dribbling.  No family history of prostate disease.  Prostate exam was normal.His wife is here with him today.  Past Medical History:  Diagnosis Date   Asthma     No past surgical history on file.  Family History  Problem Relation Age of Onset   Diabetes Father    Heart disease Father    Allergic rhinitis Mother     Social History   Socioeconomic History   Marital status: Married    Spouse name: Not on file   Number of children: Not on file   Years of education: Not on file   Highest education level: Not on file  Occupational History   Not on file  Tobacco Use   Smoking status: Never   Smokeless tobacco: Never  Vaping Use   Vaping Use: Never used  Substance and Sexual Activity   Alcohol use: Never   Drug use: Never   Sexual activity: Not on file  Other Topics Concern   Not on file  Social History Narrative   Not on file   Social Determinants of Health   Financial Resource Strain: Not on file  Food Insecurity: Not on file  Transportation Needs: Not on file  Physical  Activity: Not on file  Stress: Not on file  Social Connections: Not on file  Intimate Partner Violence: Not on file    Outpatient Medications Prior to Visit  Medication Sig Dispense Refill   cetirizine (ZYRTEC) 10 MG tablet Take 10 mg by mouth daily.     ibuprofen (ADVIL) 600 MG tablet TAKE 1 TABLET(600 MG) BY MOUTH TWICE DAILY AS NEEDED 60 tablet 0   No facility-administered medications prior to visit.    No Known Allergies  ROS Review of Systems  Constitutional: Negative.   HENT: Negative.    Eyes:  Negative for photophobia and visual disturbance.  Respiratory: Negative.    Cardiovascular: Negative.   Gastrointestinal: Negative.  Negative for constipation and diarrhea.  Genitourinary:  Positive for frequency and urgency. Negative for difficulty urinating, dysuria and penile discharge.  Musculoskeletal:  Positive for arthralgias and myalgias.  Skin:  Negative for rash.  Neurological:  Negative for weakness and numbness.  Psychiatric/Behavioral: Negative.       Objective:    Physical Exam Vitals and nursing note reviewed.  Constitutional:      General: He is not in acute distress.    Appearance: Normal appearance. He is not  ill-appearing, toxic-appearing or diaphoretic.  HENT:     Head: Normocephalic and atraumatic.     Right Ear: External ear normal.     Left Ear: External ear normal.  Eyes:     General: No scleral icterus.       Right eye: No discharge.        Left eye: No discharge.     Conjunctiva/sclera: Conjunctivae normal.  Pulmonary:     Effort: Pulmonary effort is normal.  Musculoskeletal:     Lumbar back: Normal.     Right hip: Normal.     Left hip: Normal.     Right upper leg: Normal.     Left upper leg: Normal.     Right knee: Normal.     Left knee: Normal.     Right lower leg: Normal.     Left lower leg: Normal.     Right ankle: Normal.     Left ankle: Normal.  Neurological:     Mental Status: He is alert.  Cough.  Tell no one else today on  not taken out out she can come and underwater bothering you nonocular talked her on the phone printed his  BP 104/66   Pulse 69   Temp 97.8 F (36.6 C) (Temporal)   Ht 5\' 8"  (1.727 m)   Wt 186 lb (84.4 kg)   SpO2 98%   BMI 28.28 kg/m  Wt Readings from Last 3 Encounters:  09/02/20 186 lb (84.4 kg)  04/15/20 169 lb 12.8 oz (77 kg)  01/01/19 163 lb (73.9 kg)     Health Maintenance Due  Topic Date Due   TETANUS/TDAP  Never done    There are no preventive care reminders to display for this patient.  Lab Results  Component Value Date   TSH 2.24 08/05/2018   Lab Results  Component Value Date   WBC 4.3 08/05/2018   HGB 15.0 08/05/2018   HCT 42.1 08/05/2018   MCV 84.6 08/05/2018   PLT 165.0 08/05/2018   Lab Results  Component Value Date   NA 139 04/15/2020   K 4.0 04/15/2020   CO2 28 04/15/2020   GLUCOSE 89 04/15/2020   BUN 17 04/15/2020   CREATININE 0.86 04/15/2020   BILITOT 0.6 08/05/2018   ALKPHOS 64 08/05/2018   AST 21 04/15/2020   ALT 22 04/15/2020   PROT 7.0 08/05/2018   ALBUMIN 4.7 08/05/2018   CALCIUM 10.0 04/15/2020   GFR 113.45 04/15/2020   Lab Results  Component Value Date   CHOL 193 04/15/2020   Lab Results  Component Value Date   HDL 63.50 04/15/2020   Lab Results  Component Value Date   LDLCALC 109 (H) 04/15/2020   Lab Results  Component Value Date   TRIG 101.0 04/15/2020   Lab Results  Component Value Date   CHOLHDL 3 04/15/2020   Lab Results  Component Value Date   HGBA1C 5.4 04/15/2020      Assessment & Plan:   Problem List Items Addressed This Visit       Other   Myalgia   Relevant Medications   meloxicam (MOBIC) 7.5 MG tablet   Other Relevant Orders   CK Total (and CKMB)   Sedimentation rate   Irritable bladder - Primary   Relevant Medications   oxybutynin (DITROPAN XL) 5 MG 24 hr tablet   Other Relevant Orders   Urinalysis, Routine w reflex microscopic   Other Visit Diagnoses     Arthralgia, unspecified  joint  Relevant Medications   meloxicam (MOBIC) 7.5 MG tablet   Other Relevant Orders   CK Total (and CKMB)   Sedimentation rate       Meds ordered this encounter  Medications   oxybutynin (DITROPAN XL) 5 MG 24 hr tablet    Sig: Take 1 tablet (5 mg total) by mouth at bedtime.    Dispense:  30 tablet    Refill:  1   meloxicam (MOBIC) 7.5 MG tablet    Sig: Take 1 tablet (7.5 mg total) by mouth daily.    Dispense:  30 tablet    Refill:  1    Follow-up: Return in about 2 months (around 11/02/2020).   Again patient was advised to decrease the caffeine he consumes especially after 12:00 PM.  Asked him to avoid fluid intake a few hours before bedtime.  Trial of oxybutynin nightly.  We will use meloxicam for arthralgias and myalgias.  Inflammatory markers and CK-MB to be measured. Mliss Sax, MD

## 2020-09-03 LAB — CK TOTAL AND CKMB (NOT AT ARMC)
CK, MB: <0.7 ng/mL (ref 0–5.0)
Total CK: 298 U/L — ABNORMAL HIGH (ref 44–196)

## 2020-09-04 ENCOUNTER — Encounter: Payer: Self-pay | Admitting: Family Medicine

## 2020-09-04 ENCOUNTER — Emergency Department (HOSPITAL_BASED_OUTPATIENT_CLINIC_OR_DEPARTMENT_OTHER)
Admission: EM | Admit: 2020-09-04 | Discharge: 2020-09-04 | Disposition: A | Discharge status: Home / Self Care | Payer: Commercial Managed Care - PPO | Attending: Emergency Medicine | Admitting: Emergency Medicine

## 2020-09-04 ENCOUNTER — Other Ambulatory Visit: Payer: Self-pay

## 2020-09-04 ENCOUNTER — Telehealth: Payer: Commercial Managed Care - PPO | Admitting: Emergency Medicine

## 2020-09-04 ENCOUNTER — Encounter (HOSPITAL_BASED_OUTPATIENT_CLINIC_OR_DEPARTMENT_OTHER): Payer: Self-pay | Admitting: Emergency Medicine

## 2020-09-04 DIAGNOSIS — M79661 Pain in right lower leg: Secondary | ICD-10-CM | POA: Insufficient documentation

## 2020-09-04 DIAGNOSIS — M25552 Pain in left hip: Secondary | ICD-10-CM | POA: Diagnosis not present

## 2020-09-04 DIAGNOSIS — M25572 Pain in left ankle and joints of left foot: Secondary | ICD-10-CM | POA: Diagnosis not present

## 2020-09-04 DIAGNOSIS — J45909 Unspecified asthma, uncomplicated: Secondary | ICD-10-CM | POA: Insufficient documentation

## 2020-09-04 DIAGNOSIS — M25551 Pain in right hip: Secondary | ICD-10-CM | POA: Insufficient documentation

## 2020-09-04 DIAGNOSIS — R5383 Other fatigue: Secondary | ICD-10-CM | POA: Diagnosis not present

## 2020-09-04 DIAGNOSIS — M25571 Pain in right ankle and joints of right foot: Secondary | ICD-10-CM | POA: Diagnosis not present

## 2020-09-04 DIAGNOSIS — M791 Myalgia, unspecified site: Secondary | ICD-10-CM

## 2020-09-04 DIAGNOSIS — M79662 Pain in left lower leg: Secondary | ICD-10-CM | POA: Diagnosis not present

## 2020-09-04 NOTE — ED Provider Notes (Signed)
[facility] Provider Note   CSN: [id number] Arrival date & time: 09/04/20  1002     History Chief Complaint  Patient presents with   Leg Pain    Patrick Parsons is a 35 y.o. male with no relevant past medical history presents the ED with a 3-week history of bilateral leg pain.  I reviewed his medical record and he was seen 09/02/2020 at his primary care office for his recent muscle and joint pains involving lower extremities.  He was diagnosed with nonspecific myalgias/arthralgias and discharged home with instructions to take Mobic 7.5 mg.  They also obtained a CK and sed rate for further evaluation.  His ESR was within normal limits.  CK only minimally elevated at 298.  Regardless, when he followed up with his primary care provider regarding this abnormal test he was sent to the ED for ongoing evaluation and management.  On my examination, patient reports that his symptoms have been getting progressively worse.  He is still able to stand and ambulate, but states that his legs feel "fatigued".  He also is endorsing achy discomfort in his bilateral ankles, calves, and hips.  He denies any recent illness or infection that precipitated his progressively worsening ascending muscle weakness and leg pains.  He does have 1 year history of low back discomfort, x-rays were negative in February 2022.  Denies any radicular symptoms.  No incontinence, inability to get an erection, numbness of legs.  Denies any objective weakness.  No history of malignancy or autoimmune disease.  Patient states that he was simply scared because he googled elevated CK and read about a lot of brain and cardiac disorders.  Denies any precipitating trauma, fevers or chills, chest pain or shortness of breath, blurred vision or headaches, or any other symptoms.  HPI     Past Medical History:  Diagnosis Date   Asthma     Patient Active Problem List   Diagnosis Date Noted   Myalgia  09/02/2020   Irritable bladder 09/02/2020   Strain of lumbar region 01/23/2019   Non-compliant patient 01/23/2019   Hordeolum externum of right lower eyelid 01/01/2019   Depression with anxiety 01/01/2019   Incomplete emptying of bladder 09/25/2018   Nocturia 09/25/2018   Urinary frequency 09/25/2018   Mild persistent asthma, uncomplicated 19/14/7829   Mild reactive airways disease 09/04/2018   Seasonal allergic rhinitis due to pollen 09/04/2018   Encounter for health maintenance examination with abnormal findings 08/05/2018   Constipation by delayed colonic transit 08/05/2018   Right lower quadrant abdominal pain 08/05/2018    History reviewed. No pertinent surgical history.     Family History  Problem Relation Age of Onset   Diabetes Father    Heart disease Father    Allergic rhinitis Mother     Social History   Tobacco Use   Smoking status: Never   Smokeless tobacco: Never  Vaping Use   Vaping Use: Never used  Substance Use Topics   Alcohol use: Never   Drug use: Never    Home Medications Prior to Admission medications   Medication Sig Start Date End Date Taking? Authorizing Provider  cetirizine (ZYRTEC) 10 MG tablet Take 10 mg by mouth daily.    [provider]  meloxicam (MOBIC) 7.5 MG tablet Take 1 tablet (7.5 mg total) by mouth daily. 09/02/20   Libby Maw, MD  oxybutynin (DITROPAN XL) 5 MG 24 hr tablet Take 1 tablet (5 mg total) by mouth at bedtime. 09/02/20  Libby Maw, MD    Allergies    Patient has no known allergies.  Review of Systems   Review of Systems  All other systems reviewed and are negative.  Physical Exam Updated Vital Signs BP 123/76 (BP Location: Left Arm)   Pulse 85   Temp 98.7 F (37.1 C) (Oral)   Resp 18   Ht $R'5\' 8"'Po$  (1.727 m)   Wt 84.4 kg   SpO2 100%   BMI 28.28 kg/m   Physical Exam Vitals and nursing note reviewed. Exam conducted with a chaperone present.  Constitutional:      General: He  is not in acute distress.    Appearance: Normal appearance. He is not toxic-appearing.  HENT:     Head: Normocephalic and atraumatic.  Eyes:     General: No scleral icterus.    Conjunctiva/sclera: Conjunctivae normal.  Cardiovascular:     Rate and Rhythm: Normal rate.     Pulses: Normal pulses.  Pulmonary:     Effort: Pulmonary effort is normal. No respiratory distress.  Musculoskeletal:        General: No swelling, tenderness, deformity or signs of injury. Normal range of motion.     Cervical back: Normal range of motion.     Right lower leg: No edema.     Left lower leg: No edema.  Skin:    General: Skin is dry.     Capillary Refill: Capillary refill takes less than 2 seconds.  Neurological:     General: No focal deficit present.     Mental Status: He is alert and oriented to person, place, and time.     GCS: GCS eye subscore is 4. GCS verbal subscore is 5. GCS motor subscore is 6.     Cranial Nerves: No cranial nerve deficit.     Sensory: No sensory deficit.  Psychiatric:        Mood and Affect: Mood normal.        Behavior: Behavior normal.        Thought Content: Thought content normal.    ED Results / Procedures / Treatments   Labs (all labs ordered are listed, but only abnormal results are displayed) Labs Reviewed - No data to display  EKG None  Radiology No results found.  Procedures Procedures   Medications Ordered in ED Medications - No data to display  ED Course  I have reviewed the triage vital signs and the nursing notes.  Pertinent labs & imaging results that were available during my care of the patient were reviewed by me and considered in my medical decision making (see chart for details).    MDM Rules/Calculators/A&P                          Patrick Parsons was evaluated in Emergency Department on 09/04/2020 for the symptoms described in the history of present illness. He was evaluated in the context of the global COVID-19 pandemic, which  necessitated consideration that the patient might be at risk for infection with the SARS-CoV-2 virus that causes COVID-19. Institutional protocols and algorithms that pertain to the evaluation of patients at risk for COVID-19 are in a state of rapid change based on information released by regulatory bodies including the CDC and federal and state organizations. These policies and algorithms were followed during the patient's care in the ED.  I personally reviewed patient's medical chart and all notes from triage and staff during today's encounter. I have  also ordered and reviewed all labs and imaging that I felt to be medically necessary in the evaluation of this patient's complaints and with consideration of their physical exam. If needed, translation services were available and utilized.   Patient's neurologic exam is benign.  I explained to him that his CK was only minimally elevated and not diagnostic of even mild rhabdomyolysis.  This also would be inconsistent with his timeline/history of present illness.  His 3 to 4-week history of bilateral leg myalgias and subjective weakness in context of mildly elevated CK in 200s is more appropriate for continued outpatient work-up.    He denies any illicit drug use, IVDA, or history of malignancy.  No fevers or chills.  No incontinence.  Lower suspicion for epidural abscess or cord compression.  Do not feel as though MRI low back emergently warranted.  No significant midline tenderness or masses noted.  He is able to move lower extremities with good strength.  Considered rheumatology, but reassured by a normal ESR.  CK only mildly elevated in 200s, recommending increased oral hydration.  No repeat imaging or "work-up warranted.  Doubt GBS given lack of any objective weakness or recent illness.  Cannot definitively rule out neuromuscular disorder.  Will refer to neurology for ongoing evaluation management.  ER return precautions discussed.  Patient voices  understanding is agreeable.  Final Clinical Impression(s) / ED Diagnoses Final diagnoses:  Myalgia    Rx / DC Orders ED Discharge Orders          Ordered    Ambulatory referral to Neurology       Comments: An appointment is requested in approximately: 1 week   09/04/20 [place], Shavell Nored L, PA-C 09/04/20 1055    Truddie Hidden, MD 09/04/20 [phone]

## 2020-09-04 NOTE — ED Triage Notes (Signed)
Pt c/o bilateral leg pain x 4 weeks or more. Pt denies injury does stand at work for long hours. Pt seen by Primary care doctor on Friday, reports seen abnormal test results on my chart (CK 298). Pt did not receive call from primary care doctor about test results.

## 2020-09-04 NOTE — Discharge Instructions (Addendum)
I have reviewed labs obtained from your encounter with primary care provider.  They are all reassuring.  However, given your history of present illness, suspect the benefit from referral to neurology.  I have placed ambulatory referral to [facility].  They will call you to schedule an appointment.  Call them if you do not hear from them next 48 hours.  Return to the ER seek immediate medical attention if you develop any severe worsening of your low back pain, peeing or pooping on yourself, inability to move your legs, fevers or chills, or any other new or worsening symptoms.

## 2020-09-04 NOTE — Progress Notes (Signed)
Called patient and advised to go to emergency department for further evaluation and treatment of symptoms.  Pt understanding and agreeable with plan for today.

## 2020-09-05 ENCOUNTER — Encounter: Payer: Self-pay | Admitting: Neurology

## 2020-09-05 NOTE — Progress Notes (Signed)
Have sent a note previously about these lab results.  Patient is to hydrate well and to return for CK isoenzymes.  He will also follow-up with neurology as directed in ER.

## 2020-09-29 ENCOUNTER — Ambulatory Visit: Payer: Commercial Managed Care - PPO | Admitting: Family Medicine

## 2020-10-28 ENCOUNTER — Ambulatory Visit: Payer: Managed Care, Other (non HMO) | Admitting: Family Medicine

## 2020-10-28 ENCOUNTER — Encounter: Payer: Self-pay | Admitting: Family Medicine

## 2020-10-28 ENCOUNTER — Other Ambulatory Visit: Payer: Self-pay

## 2020-10-28 VITALS — BP 128/72 | HR 69 | Temp 97.9°F | Wt 193.0 lb

## 2020-10-28 DIAGNOSIS — R0681 Apnea, not elsewhere classified: Secondary | ICD-10-CM | POA: Diagnosis not present

## 2020-10-28 DIAGNOSIS — M791 Myalgia, unspecified site: Secondary | ICD-10-CM | POA: Diagnosis not present

## 2020-10-28 DIAGNOSIS — R351 Nocturia: Secondary | ICD-10-CM

## 2020-10-28 NOTE — Progress Notes (Signed)
Established Patient Office Visit  Subjective:  Patient ID: Patrick Parsons, male    DOB: 09/08/1985  Age: 35 y.o. MRN: [id number]  CC:  Chief Complaint  Patient presents with   Follow-up    Follow up on labs      HPI Patrick Parsons presents for follow-up the myalgias he was experiencing in his lower extremities, urinary frequency and nocturia.  He had tried Ditropan for his symptoms which led to urinary hesitancy and then discontinued it.  Frequency of urination has resolved but he still experiences nocturia several times nightly.  He has tried fluid restricting an hour or 2 before bedtime he tells me.  He continues to consume caffeine through the afternoon or sleepiness.  Muscle aches and pains in his legs have mostly resolved.  He has not new job that involves sitting mostly.  He is able to stand every 30 minutes or so and stretch his back.  He has had some problems with sleeping.  He cannot sleep on his back.  He wakes up frequently throughout the night and is out of breath.  His wife tells him that he snores loudly sometimes.  Past Medical History:  Diagnosis Date   Asthma     History reviewed. No pertinent surgical history.  Family History  Problem Relation Age of Onset   Diabetes Father    Heart disease Father    Allergic rhinitis Mother     Social History   Socioeconomic History   Marital status: Married    Spouse name: Not on file   Number of children: Not on file   Years of education: Not on file   Highest education level: Not on file  Occupational History   Not on file  Tobacco Use   Smoking status: Never   Smokeless tobacco: Never  Vaping Use   Vaping Use: Never used  Substance and Sexual Activity   Alcohol use: Never   Drug use: Never   Sexual activity: Not on file  Other Topics Concern   Not on file  Social History Narrative   Not on file   Social Determinants of Health   Financial Resource Strain: Not on file  Food Insecurity: Not on file   Transportation Needs: Not on file  Physical Activity: Not on file  Stress: Not on file  Social Connections: Not on file  Intimate Partner Violence: Not on file    Outpatient Medications Prior to Visit  Medication Sig Dispense Refill   cetirizine (ZYRTEC) 10 MG tablet Take 10 mg by mouth daily. (Patient not taking: Reported on 10/28/2020)     meloxicam (MOBIC) 7.5 MG tablet Take 1 tablet (7.5 mg total) by mouth daily. (Patient not taking: Reported on 10/28/2020) 30 tablet 1   oxybutynin (DITROPAN XL) 5 MG 24 hr tablet Take 1 tablet (5 mg total) by mouth at bedtime. (Patient not taking: Reported on 10/28/2020) 30 tablet 1   No facility-administered medications prior to visit.    No Known Allergies  ROS Review of Systems  Constitutional: Negative.   HENT: Negative.    Eyes:  Negative for photophobia and visual disturbance.  Respiratory:  Positive for apnea.   Cardiovascular: Negative.   Gastrointestinal: Negative.   Endocrine: Negative for polyphagia and polyuria.  Genitourinary:  Negative for difficulty urinating, frequency and urgency.  Musculoskeletal:  Negative for myalgias.  Neurological:  Negative for weakness and numbness.     Objective:    Physical Exam Vitals and nursing note reviewed.  Constitutional:  General: He is not in acute distress.    Appearance: Normal appearance. He is not ill-appearing, toxic-appearing or diaphoretic.  HENT:     Head: Normocephalic and atraumatic.     Right Ear: Tympanic membrane, ear canal and external ear normal.     Left Ear: Tympanic membrane, ear canal and external ear normal.     Mouth/Throat:     Mouth: Mucous membranes are dry.     Pharynx: Oropharynx is clear. No oropharyngeal exudate or posterior oropharyngeal erythema.   Eyes:     General: No scleral icterus.       Right eye: No discharge.        Left eye: No discharge.     Extraocular Movements: Extraocular movements intact.     Conjunctiva/sclera: Conjunctivae  normal.     Pupils: Pupils are equal, round, and reactive to light.  Cardiovascular:     Rate and Rhythm: Regular rhythm.  Pulmonary:     Effort: Pulmonary effort is normal.     Breath sounds: Normal breath sounds.  Abdominal:     General: Abdomen is flat. Bowel sounds are normal.     Palpations: Abdomen is soft.  Musculoskeletal:     Cervical back: No rigidity or tenderness.  Lymphadenopathy:     Cervical: No cervical adenopathy.  Skin:    General: Skin is warm and dry.  Neurological:     Mental Status: He is alert and oriented to person, place, and time.  Psychiatric:        Mood and Affect: Mood normal.        Behavior: Behavior normal.  Hey did you just calling  BP 128/72 (BP Location: Left Arm, Patient Position: Sitting, Cuff Size: Large)   Pulse 69   Temp 97.9 F (36.6 C) (Oral)   Wt 193 lb (87.5 kg)   SpO2 100%   BMI 29.35 kg/m  Wt Readings from Last 3 Encounters:  10/28/20 193 lb (87.5 kg)  09/04/20 186 lb (84.4 kg)  09/02/20 186 lb (84.4 kg)     Health Maintenance Due  Topic Date Due   TETANUS/TDAP  Never done   INFLUENZA VACCINE  10/10/2020    There are no preventive care reminders to display for this patient.  Lab Results  Component Value Date   TSH 2.24 08/05/2018   Lab Results  Component Value Date   WBC 4.3 08/05/2018   HGB 15.0 08/05/2018   HCT 42.1 08/05/2018   MCV 84.6 08/05/2018   PLT 165.0 08/05/2018   Lab Results  Component Value Date   NA 139 04/15/2020   K 4.0 04/15/2020   CO2 28 04/15/2020   GLUCOSE 89 04/15/2020   BUN 17 04/15/2020   CREATININE 0.86 04/15/2020   BILITOT 0.6 08/05/2018   ALKPHOS 64 08/05/2018   AST 21 04/15/2020   ALT 22 04/15/2020   PROT 7.0 08/05/2018   ALBUMIN 4.7 08/05/2018   CALCIUM 10.0 04/15/2020   GFR 113.45 04/15/2020   Lab Results  Component Value Date   CHOL 193 04/15/2020   Lab Results  Component Value Date   HDL 63.50 04/15/2020   Lab Results  Component Value Date   LDLCALC 109  (H) 04/15/2020   Lab Results  Component Value Date   TRIG 101.0 04/15/2020   Lab Results  Component Value Date   CHOLHDL 3 04/15/2020   Lab Results  Component Value Date   HGBA1C 5.4 04/15/2020      Assessment & Plan:   Problem  List Items Addressed This Visit       Other   Nocturia - Primary   Myalgia   Apnea   Relevant Orders   Ambulatory referral to Sleep Studies    No orders of the defined types were placed in this encounter.   Follow-up: Return in about 3 months (around 01/28/2021).   And lieu of urology referral patient would like to pends discontinue all caffeine past morning hours.  We will continue to fluid restrict nighttime.  Sounds as though he has apnea.  We will send for sleep study.  Follow-up in 3 months. Mliss Sax, MD

## 2020-10-31 ENCOUNTER — Telehealth: Payer: Self-pay | Admitting: Family Medicine

## 2020-10-31 NOTE — Telephone Encounter (Signed)
I called pt about message we received:  Appointment Request From: Rodolph Bong    With Provider: Mliss Sax, MD [[facility]]    Preferred Date Range: 11/18/2020 - 11/25/2020    Preferred Times: Monday Afternoon, Friday Afternoon    Reason for visit: Annual Physical    Comments:  Liver, kidney test and vitamin D   Unable to leave a voicemail

## 2020-11-04 ENCOUNTER — Ambulatory Visit: Payer: Managed Care, Other (non HMO) | Admitting: Neurology

## 2020-11-04 ENCOUNTER — Ambulatory Visit: Payer: Commercial Managed Care - PPO | Admitting: Neurology

## 2020-11-04 ENCOUNTER — Other Ambulatory Visit: Payer: Self-pay

## 2020-11-04 ENCOUNTER — Encounter: Payer: Self-pay | Admitting: Neurology

## 2020-11-04 VITALS — BP 116/75 | HR 72 | Ht 68.0 in | Wt 189.0 lb

## 2020-11-04 DIAGNOSIS — R531 Weakness: Secondary | ICD-10-CM | POA: Diagnosis not present

## 2020-11-04 DIAGNOSIS — R748 Abnormal levels of other serum enzymes: Secondary | ICD-10-CM

## 2020-11-04 NOTE — Progress Notes (Signed)
[facility] Neurology Division Clinic Note - Initial Visit   Date: 11/04/20  Patrick Parsons MRN: [id number] DOB: October 04, 1985   Dear Evelena Leyden, PA-C:  Thank you for your kind referral of Patrick Parsons for consultation of leg weakness. Although his history is well known to you, please allow [place] to reiterate it for the purpose of our medical record. The patient was accompanied to the clinic by self.    History of Present Illness: Patrick Parsons is a 35 y.o. right-handed male presenting for evaluation of weakness in the legs. In June, he had weakness in the legs which involves his lower legs and thighs.  It was worse after he would be walking for 2-3 hours.  He saw his PCP who checked CK which returned mildly elevated 298.  He was seen in the ER for these symptoms and referred to see me.  He says that his weakness has improved after doing more of desk duty.  He continues to have tired sensation in the ankles and calves.  No cramps.  No arms weakness or stiffness. He started going to the gym 2 weeks ago and denies difficulty with running/exercising.    Out-side paper records, electronic medical record, and images have been reviewed where available and summarized as:  Lab Results  Component Value Date   HGBA1C 5.4 04/15/2020   No results found for: [id number] Lab Results  Component Value Date   TSH 2.24 08/05/2018   Lab Results  Component Value Date   ESRSEDRATE 8 09/02/2020   Lab Results  Component Value Date   CKTOTAL 298 (H) 09/02/2020   CKMB <0.7 09/02/2020     Past Medical History:  Diagnosis Date   Asthma     History reviewed. No pertinent surgical history.   Medications:  No outpatient encounter medications on file as of 11/04/2020.   No facility-administered encounter medications on file as of 11/04/2020.    Allergies: No Known Allergies  Family History: Family History  Problem Relation Age of Onset   Allergic rhinitis Mother    Diabetes Father     Heart disease Father    Heart Problems Maternal Grandmother     Social History: Social History   Tobacco Use   Smoking status: Never   Smokeless tobacco: Never  Vaping Use   Vaping Use: Never used  Substance Use Topics   Alcohol use: Never   Drug use: Never   Social History   Social History Narrative   Right Handed    Does not have any children yet    Lives in a one story home in an apartment complex    Vital Signs:  BP 116/75   Pulse 72   Ht 5\' 8"  (1.727 m)   Wt 189 lb (85.7 kg)   SpO2 98%   BMI 28.74 kg/m    Neurological Exam: MENTAL STATUS including orientation to time, place, person, recent and remote memory, attention span and concentration, language, and fund of knowledge is normal.  Speech is not dysarthric.  CRANIAL NERVES: II:  No visual field defects.    III-IV-VI: Pupils equal round and reactive to light.  Normal conjugate, extra-ocular eye movements in all directions of gaze.  No nystagmus.  No ptosis.   V:  Normal facial sensation.    VII:  Normal facial symmetry and movements.   VIII:  Normal hearing and vestibular function.   IX-X:  Normal palatal movement.   XI:  Normal shoulder shrug and head rotation.   XII:  Normal tongue strength and range of motion, no deviation or fasciculation.  MOTOR:  No atrophy, fasciculations or abnormal movements.  No pronator drift. No percussion or grip myotonia.  Upper Extremity:  Right  Left  Deltoid  5/5   5/5   Biceps  5/5   5/5   Triceps  5/5   5/5   Infraspinatus 5/5  5/5  Medial pectoralis 5/5  5/5  Wrist extensors  5/5   5/5   Wrist flexors  5/5   5/5   Finger extensors  5/5   5/5   Finger flexors  5/5   5/5   Dorsal interossei  5/5   5/5   Abductor pollicis  5/5   5/5   Tone (Ashworth scale)  0  0   Lower Extremity:  Right  Left  Hip flexors  5/5   5/5   Hip extensors  5/5   5/5   Adductor 5/5  5/5  Abductor 5/5  5/5  Knee flexors  5/5   5/5   Knee extensors  5/5   5/5   Dorsiflexors  5/5    5/5   Plantarflexors  5/5   5/5   Toe extensors  5/5   5/5   Toe flexors  5/5   5/5   Tone (Ashworth scale)  0  0   MSRs:  Right        Left                  brachioradialis 2+  2+  biceps 2+  2+  triceps 2+  2+  patellar 2+  2+  ankle jerk 2+  2+  Hoffman no  no  plantar response down  down   SENSORY:  Normal and symmetric perception of light touch, pinprick, vibration, and proprioception.  Romberg's sign absent.   COORDINATION/GAIT: Normal finger-to- nose-finger.  Intact rapid alternating movements bilaterally.  Able to rise from a chair without using arms.  Gait narrow based and stable. Tandem and stressed gait intact.    IMPRESSION: Generalized weakness (subjective) in the legs in the setting of mild CK elevation. Neurological exam is normal with no focal weakness.  I will repeat his CK as well TSH, vitamin B12, and vitamin B1.  I also offered NCS/EMG of the legs to further investigate his symptoms but he prefers to wait until his labs have resulted prior to deciding whether to proceed with testing or not.   Thank you for allowing me to participate in patient's care.  If I can answer any additional questions, I would be pleased to do so.    Sincerely,    Aidyn Sportsman K. Allena Katz, DO

## 2020-11-04 NOTE — Patient Instructions (Addendum)
Check labs on Monday  ELECTROMYOGRAM AND NERVE CONDUCTION STUDIES (EMG/NCS) INSTRUCTIONS  How to Prepare The neurologist conducting the EMG will need to know if you have certain medical conditions. Tell the neurologist and other EMG lab personnel if you: Have a pacemaker or any other electrical medical device Take blood-thinning medications Have hemophilia, a blood-clotting disorder that causes prolonged bleeding Bathing Take a shower or bath shortly before your exam in order to remove oils from your skin. Don't apply lotions or creams before the exam.  What to Expect You'll likely be asked to change into a hospital gown for the procedure and lie down on an examination table. The following explanations can help you understand what will happen during the exam.  Electrodes. The neurologist or a technician places surface electrodes at various locations on your skin depending on where you're experiencing symptoms. Or the neurologist may insert needle electrodes at different sites depending on your symptoms.  Sensations. The electrodes will at times transmit a tiny electrical current that you may feel as a twinge or spasm. The needle electrode may cause discomfort or pain that usually ends shortly after the needle is removed. If you are concerned about discomfort or pain, you may want to talk to the neurologist about taking a short break during the exam.  Instructions. During the needle EMG, the neurologist will assess whether there is any spontaneous electrical activity when the muscle is at rest - activity that isn't present in healthy muscle tissue - and the degree of activity when you slightly contract the muscle.  He or she will give you instructions on resting and contracting a muscle at appropriate times. Depending on what muscles and nerves the neurologist is examining, he or she may ask you to change positions during the exam.  After your EMG You may experience some temporary, minor bruising  where the needle electrode was inserted into your muscle. This bruising should fade within several days. If it persists, contact your primary care doctor.

## 2020-11-07 ENCOUNTER — Other Ambulatory Visit: Payer: Managed Care, Other (non HMO)

## 2020-11-07 ENCOUNTER — Other Ambulatory Visit: Payer: Self-pay

## 2020-11-07 ENCOUNTER — Other Ambulatory Visit (INDEPENDENT_AMBULATORY_CARE_PROVIDER_SITE_OTHER): Payer: Managed Care, Other (non HMO)

## 2020-11-07 DIAGNOSIS — R531 Weakness: Secondary | ICD-10-CM

## 2020-11-07 DIAGNOSIS — R748 Abnormal levels of other serum enzymes: Secondary | ICD-10-CM

## 2020-11-07 LAB — TSH: TSH: 2.59 u[IU]/mL (ref 0.35–5.50)

## 2020-11-07 LAB — VITAMIN B12: Vitamin B-12: 528 pg/mL (ref 211–911)

## 2020-11-07 LAB — CK: Total CK: 219 U/L (ref 7–232)

## 2020-11-08 ENCOUNTER — Other Ambulatory Visit: Payer: Self-pay

## 2020-11-08 DIAGNOSIS — R202 Paresthesia of skin: Secondary | ICD-10-CM

## 2020-11-10 ENCOUNTER — Ambulatory Visit: Payer: Commercial Managed Care - PPO | Admitting: Family Medicine

## 2020-11-11 ENCOUNTER — Encounter: Payer: Self-pay | Admitting: Family Medicine

## 2020-11-11 ENCOUNTER — Other Ambulatory Visit: Payer: Self-pay

## 2020-11-11 ENCOUNTER — Ambulatory Visit: Payer: Managed Care, Other (non HMO) | Admitting: Family Medicine

## 2020-11-11 VITALS — BP 115/74 | HR 72 | Temp 97.7°F | Ht 68.0 in | Wt 188.2 lb

## 2020-11-11 DIAGNOSIS — R0683 Snoring: Secondary | ICD-10-CM

## 2020-11-11 DIAGNOSIS — F418 Other specified anxiety disorders: Secondary | ICD-10-CM

## 2020-11-11 LAB — VITAMIN B1: Vitamin B1 (Thiamine): 24 nmol/L (ref 8–30)

## 2020-11-11 MED ORDER — FLUOXETINE HCL 10 MG PO TABS
ORAL_TABLET | ORAL | 1 refills | Status: DC
Start: 2020-11-11 — End: 2020-12-12

## 2020-11-11 NOTE — Progress Notes (Signed)
Established Patient Office Visit  Subjective:  Patient ID: Patrick Parsons, male    DOB: [birth year]  Age: 35 y.o. MRN: [id number]  CC:  Chief Complaint  Patient presents with   Sleep Apnea    Concerns about sleep apnea have not been able to be scheduled for sleep study yet.     HPI Patrick Parsons presents for ongoing issues with sleep.  He does tend to snore, loudly at times.  He believes that his wife has noted apneic episodes.  He does not feel rested in the morning.  He has ongoing stress about health issues.  He is under work-up by neurology for peripheral neuropathy.  Screening blood work has been negative.  Nerve conduction studies have been ordered.  His wife is expecting their first child.  There is some stress at work.  He does worry and has been sad.  Past Medical History:  Diagnosis Date   Asthma     No past surgical history on file.  Family History  Problem Relation Age of Onset   Allergic rhinitis Mother    Diabetes Father    Heart disease Father    Heart Problems Maternal Grandmother     Social History   Socioeconomic History   Marital status: Married    Spouse name: Not on file   Number of children: Not on file   Years of education: Not on file   Highest education level: Not on file  Occupational History   Not on file  Tobacco Use   Smoking status: Never   Smokeless tobacco: Never  Vaping Use   Vaping Use: Never used  Substance and Sexual Activity   Alcohol use: Never   Drug use: Never   Sexual activity: Not on file  Other Topics Concern   Not on file  Social History Narrative   Right Handed    Does not have any children yet    Lives in a one story home in an apartment complex   Social Determinants of Health   Financial Resource Strain: Not on file  Food Insecurity: Not on file  Transportation Needs: Not on file  Physical Activity: Not on file  Stress: Not on file  Social Connections: Not on file  Intimate Partner Violence: Not on file     No outpatient medications prior to visit.   No facility-administered medications prior to visit.    No Known Allergies  ROS Review of Systems  Constitutional:  Negative for appetite change, diaphoresis, fatigue, fever and unexpected weight change.  HENT: Negative.    Eyes:  Negative for photophobia.  Respiratory: Negative.    Cardiovascular: Negative.   Gastrointestinal: Negative.   Genitourinary: Negative.   Neurological:  Negative for weakness, light-headedness and numbness.  Hematological:  Does not bruise/bleed easily.  Psychiatric/Behavioral:  Positive for dysphoric mood. The patient is nervous/anxious.      Objective:    Physical Exam Vitals and nursing note reviewed.  Constitutional:      General: He is not in acute distress.    Appearance: Normal appearance. He is not ill-appearing, toxic-appearing or diaphoretic.  HENT:     Head: Normocephalic and atraumatic.     Right Ear: External ear normal.     Left Ear: External ear normal.     Mouth/Throat:     Mouth: Mucous membranes are moist.     Pharynx: Oropharyngeal exudate present. No posterior oropharyngeal erythema.   Eyes:     Conjunctiva/sclera: Conjunctivae normal.  Pupils: Pupils are equal, round, and reactive to light.  Neck:     Vascular: No carotid bruit.  Cardiovascular:     Rate and Rhythm: Normal rate and regular rhythm.  Pulmonary:     Effort: Pulmonary effort is normal.     Breath sounds: Normal breath sounds.  Musculoskeletal:     Cervical back: No rigidity or tenderness.  Lymphadenopathy:     Cervical: No cervical adenopathy.  Neurological:     Mental Status: He is alert and oriented to person, place, and time.  Psychiatric:        Mood and Affect: Mood normal.        Behavior: Behavior normal.    BP 115/74 (BP Location: Right Arm, Patient Position: Sitting, Cuff Size: Normal)   Pulse 72   Temp 97.7 F (36.5 C) (Temporal)   Ht 5\' 8"  (1.727 m)   Wt 188 lb 3.2 oz (85.4 kg)    SpO2 96%   BMI 28.62 kg/m  Wt Readings from Last 3 Encounters:  11/11/20 188 lb 3.2 oz (85.4 kg)  11/04/20 189 lb (85.7 kg)  10/28/20 193 lb (87.5 kg)     Health Maintenance Due  Topic Date Due   TETANUS/TDAP  Never done    There are no preventive care reminders to display for this patient.  Lab Results  Component Value Date   TSH 2.59 11/07/2020   Lab Results  Component Value Date   WBC 4.3 08/05/2018   HGB 15.0 08/05/2018   HCT 42.1 08/05/2018   MCV 84.6 08/05/2018   PLT 165.0 08/05/2018   Lab Results  Component Value Date   NA 139 04/15/2020   K 4.0 04/15/2020   CO2 28 04/15/2020   GLUCOSE 89 04/15/2020   BUN 17 04/15/2020   CREATININE 0.86 04/15/2020   BILITOT 0.6 08/05/2018   ALKPHOS 64 08/05/2018   AST 21 04/15/2020   ALT 22 04/15/2020   PROT 7.0 08/05/2018   ALBUMIN 4.7 08/05/2018   CALCIUM 10.0 04/15/2020   GFR 113.45 04/15/2020   Lab Results  Component Value Date   CHOL 193 04/15/2020   Lab Results  Component Value Date   HDL 63.50 04/15/2020   Lab Results  Component Value Date   LDLCALC 109 (H) 04/15/2020   Lab Results  Component Value Date   TRIG 101.0 04/15/2020   Lab Results  Component Value Date   CHOLHDL 3 04/15/2020   Lab Results  Component Value Date   HGBA1C 5.4 04/15/2020      Assessment & Plan:   Problem List Items Addressed This Visit       Other   Anxiety with depression   Relevant Medications   FLUoxetine (PROZAC) 10 MG tablet   Snores - Primary   Relevant Orders   Ambulatory referral to Sleep Studies    Meds ordered this encounter  Medications   FLUoxetine (PROZAC) 10 MG tablet    Sig: Take 1 tablet (10 mg total) by mouth daily for 7 days, THEN 2 tablets (20 mg total) daily.    Dispense:  90 tablet    Refill:  1     Follow-up: Return in about 1 month (around 12/11/2020).  Information was given about sleep apnea and stress reduction.  02/10/2021, MD

## 2020-11-30 ENCOUNTER — Other Ambulatory Visit: Payer: Self-pay

## 2020-11-30 DIAGNOSIS — R202 Paresthesia of skin: Secondary | ICD-10-CM

## 2020-12-07 ENCOUNTER — Ambulatory Visit (INDEPENDENT_AMBULATORY_CARE_PROVIDER_SITE_OTHER): Payer: Managed Care, Other (non HMO) | Admitting: Neurology

## 2020-12-07 ENCOUNTER — Other Ambulatory Visit: Payer: Self-pay

## 2020-12-07 DIAGNOSIS — R531 Weakness: Secondary | ICD-10-CM

## 2020-12-07 DIAGNOSIS — R202 Paresthesia of skin: Secondary | ICD-10-CM | POA: Diagnosis not present

## 2020-12-07 NOTE — Procedures (Signed)
[facility]  [address] [place], [address]  [place], [place] [address] Tel: [phone] Fax:  [phone] Test Date:  12/07/2020  Patient: Patrick Parsons DOB: 1985/04/26 Physician: Nita Sickle, DO  Sex: Male Height: 5\' 8"  Ref Phys: , DO  ID#: Nita Sickle   Technician:    Patient Complaints: This is a 35 year old man referred for evaluation of bilateral leg weakness.  NCV & EMG Findings: Electrodiagnostic testing of the right lower extremity and additional studies of the left shows: Bilateral sural and superficial peroneal sensory responses are within normal limits. Bilateral peroneal and tibial motor responses are within normal limits. Bilateral tibial H reflex studies are within normal limits. There is no evidence of active or chronic motor axonal changes affecting any of the tested muscles.  Motor unit configuration and recruitment pattern is within normal limits.  Impression: This is a normal study of the lower extremities.  In particular, there is no evidence of a sensorimotor polyneuropathy or lumbosacral radiculopathy.   ___________________________ 20, DO    Nerve Conduction Studies Anti Sensory Summary Table   Stim Site NR Peak (ms) Norm Peak (ms) P-T Amp (V) Norm P-T Amp  Left Sup Peroneal Anti Sensory (Ant Lat Mall)  33C  12 cm    2.9 <4.5 10.6 >5  Right Sup Peroneal Anti Sensory (Ant Lat Mall)  33C  12 cm    3.1 <4.5 12.1 >5  Left Sural Anti Sensory (Lat Mall)  33C  Calf    3.4 <4.5 21.6 >5  Right Sural Anti Sensory (Lat Mall)  33C  Calf    3.6 <4.5 19.9 >5   Motor Summary Table   Stim Site NR Onset (ms) Norm Onset (ms) O-P Amp (mV) Norm O-P Amp Site1 Site2 Delta-0 (ms) Dist (cm) Vel (m/s) Norm Vel (m/s)  Left Peroneal Motor (Ext Dig Brev)  33C  Ankle    4.1 <5.5 5.1 >3 B Fib Ankle 7.7 36.0 47 >40  B Fib    11.8  4.5  Poplt B Fib 1.4 8.0 57 >40  Poplt    13.2  4.4         Right Peroneal Motor (Ext Dig Brev)  33C  Ankle     3.8 <5.5 6.0 >3 B Fib Ankle 7.1 36.0 51 >40  B Fib    10.9  6.0  Poplt B Fib 1.4 8.0 57 >40  Poplt    12.3  5.7         Left Tibial Motor (Abd Hall Brev)  33C  Ankle    3.4 <6.0 13.6 >8 Knee Ankle 8.5 41.0 48 >40  Knee    11.9  11.1         Right Tibial Motor (Abd Hall Brev)  33C  Ankle    3.3 <6.0 15.5 >8 Knee Ankle 8.6 43.0 50 >40  Knee    11.9  11.9          H Reflex Studies   NR H-Lat (ms) Lat Norm (ms) L-R H-Lat (ms)  Left Tibial (Gastroc)  33C     31.84 <35 0.00  Right Tibial (Gastroc)  33C     31.84 <35 0.00   EMG   Side Muscle Ins Act Fibs Psw Fasc Number Recrt Dur Dur. Amp Amp. Poly Poly. Comment  Right AntTibialis Nml Nml Nml Nml Nml Nml Nml Nml Nml Nml Nml Nml N/A  Right Gastroc Nml Nml Nml Nml Nml Nml Nml Nml Nml Nml Nml Nml N/A  Right Flex Dig Long Nml Nml Nml Nml Nml Nml Nml Nml Nml Nml Nml Nml N/A  Right RectFemoris Nml Nml Nml Nml Nml Nml Nml Nml Nml Nml Nml Nml N/A  Right GluteusMed Nml Nml Nml Nml Nml Nml Nml Nml Nml Nml Nml Nml N/A  Left AntTibialis Nml Nml Nml Nml Nml Nml Nml Nml Nml Nml Nml Nml N/A  Left Gastroc Nml Nml Nml Nml Nml Nml Nml Nml Nml Nml Nml Nml N/A  Left Flex Dig Long Nml Nml Nml Nml Nml Nml Nml Nml Nml Nml Nml Nml N/A  Left RectFemoris Nml Nml Nml Nml Nml Nml Nml Nml Nml Nml Nml Nml N/A  Left GluteusMed Nml Nml Nml Nml Nml Nml Nml Nml Nml Nml Nml Nml N/A      Waveforms:

## 2020-12-12 ENCOUNTER — Encounter: Payer: Self-pay | Admitting: Family Medicine

## 2020-12-12 ENCOUNTER — Other Ambulatory Visit: Payer: Self-pay

## 2020-12-12 ENCOUNTER — Ambulatory Visit (INDEPENDENT_AMBULATORY_CARE_PROVIDER_SITE_OTHER): Payer: Managed Care, Other (non HMO) | Admitting: Family Medicine

## 2020-12-12 VITALS — BP 116/74 | HR 64 | Temp 97.6°F | Ht 68.0 in | Wt 184.0 lb

## 2020-12-12 DIAGNOSIS — K589 Irritable bowel syndrome without diarrhea: Secondary | ICD-10-CM | POA: Insufficient documentation

## 2020-12-12 DIAGNOSIS — M791 Myalgia, unspecified site: Secondary | ICD-10-CM

## 2020-12-12 DIAGNOSIS — F418 Other specified anxiety disorders: Secondary | ICD-10-CM | POA: Diagnosis not present

## 2020-12-12 DIAGNOSIS — K5901 Slow transit constipation: Secondary | ICD-10-CM

## 2020-12-12 LAB — COMPREHENSIVE METABOLIC PANEL
ALT: 26 U/L (ref 0–53)
AST: 23 U/L (ref 0–37)
Albumin: 5 g/dL (ref 3.5–5.2)
Alkaline Phosphatase: 60 U/L (ref 39–117)
BUN: 15 mg/dL (ref 6–23)
CO2: 27 mEq/L (ref 19–32)
Calcium: 10 mg/dL (ref 8.4–10.5)
Chloride: 103 mEq/L (ref 96–112)
Creatinine, Ser: 0.85 mg/dL (ref 0.40–1.50)
GFR: 113.33 mL/min (ref >60.00)
Glucose, Bld: 93 mg/dL (ref 70–99)
Potassium: 4.6 mEq/L (ref 3.5–5.1)
Sodium: 139 mEq/L (ref 135–145)
Total Bilirubin: 0.6 mg/dL (ref 0.2–1.2)
Total Protein: 7.2 g/dL (ref 6.0–8.3)

## 2020-12-12 LAB — CBC
HCT: 44.8 % (ref 39.0–52.0)
Hemoglobin: 15.3 g/dL (ref 13.0–17.0)
MCHC: 34.1 g/dL (ref 30.0–36.0)
MCV: 86.2 fl (ref 78.0–100.0)
Platelets: 177 10*3/uL (ref 150.0–400.0)
RBC: 5.2 Mil/uL (ref 4.22–5.81)
RDW: 13.7 % (ref 11.5–15.5)
WBC: 5.3 10*3/uL (ref 4.0–10.5)

## 2020-12-12 MED ORDER — FLUOXETINE HCL 20 MG PO CAPS: 20.0000 mg | ORAL_CAPSULE | Freq: Every day | ORAL | 3 refills | Status: AC

## 2020-12-12 NOTE — Progress Notes (Signed)
Established Patient Office Visit  Subjective:  Patient ID: Patrick Parsons, male    DOB: 1985/12/24  Age: 35 y.o. MRN: [id number]  CC:  Chief Complaint  Patient presents with   Annual Exam    CPE, no concerns. Patient not fasting. Still feel weakness/pain in legs that come and go.     HPI [name] presents for follow-up of anxiety with depression.  Taking fluoxetine 20 mg daily without issue.  Feels perhaps a little less stressed and anxious.  Wife is 4 months pregnant.  Status post normal nerve conduction and EMG studies per neurology of lower extremities.  Continues to feel some subjective weakness and pain.  He would like to know if he could start running.  Denies crampy abdominal pain but deals with constipation at times.  Past Medical History:  Diagnosis Date   Asthma     No past surgical history on file.  Family History  Problem Relation Age of Onset   Allergic rhinitis Mother    Diabetes Father    Heart disease Father    Heart Problems Maternal Grandmother     Social History   Socioeconomic History   Marital status: Married    Spouse name: Not on file   Number of children: Not on file   Years of education: Not on file   Highest education level: Not on file  Occupational History   Not on file  Tobacco Use   Smoking status: Never   Smokeless tobacco: Never  Vaping Use   Vaping Use: Never used  Substance and Sexual Activity   Alcohol use: Never   Drug use: Never   Sexual activity: Yes  Other Topics Concern   Not on file  Social History Narrative   Right Handed    Does not have any children yet    Lives in a one story home in an apartment complex   Social Determinants of Health   Financial Resource Strain: Not on file  Food Insecurity: Not on file  Transportation Needs: Not on file  Physical Activity: Not on file  Stress: Not on file  Social Connections: Not on file  Intimate Partner Violence: Not on file    Outpatient Medications Prior to  Visit  Medication Sig Dispense Refill   FLUoxetine (PROZAC) 10 MG tablet Take 1 tablet (10 mg total) by mouth daily for 7 days, THEN 2 tablets (20 mg total) daily. 90 tablet 1   No facility-administered medications prior to visit.    No Known Allergies  ROS Review of Systems  Constitutional:  Negative for diaphoresis, fatigue, fever and unexpected weight change.  HENT: Negative.    Respiratory: Negative.    Cardiovascular: Negative.   Gastrointestinal: Negative.   Endocrine: Negative for polyphagia and polyuria.  Genitourinary: Negative.   Musculoskeletal:  Negative for myalgias.  Skin: Negative.   Neurological:  Positive for weakness. Negative for numbness and headaches.  Psychiatric/Behavioral: Negative.    Depression screen [facility] 2/9 12/12/2020 12/12/2020 11/11/2020  Decreased Interest 1 0 0  Down, Depressed, Hopeless 0 0 0  PHQ - 2 Score 1 0 0  Altered sleeping 0 - -  Tired, decreased energy 1 - -  Change in appetite 0 - -  Feeling bad or failure about yourself  0 - -  Trouble concentrating 0 - -  Moving slowly or fidgety/restless 0 - -  Suicidal thoughts 0 - -  PHQ-9 Score 2 - -  Difficult doing work/chores Not difficult at all - -  GAD 7 : Generalized Anxiety Score 12/12/2020 01/01/2019  Nervous, Anxious, on Edge 0 1  Control/stop worrying 0 0  Worry too much - different things 0 1  Trouble relaxing 0 0  Restless 0 0  Easily annoyed or irritable 0 2  Afraid - awful might happen 0 2  Total GAD 7 Score 0 6  Anxiety Difficulty - Somewhat difficult       Objective:    Physical Exam Vitals and nursing note reviewed.  Constitutional:      General: He is not in acute distress.    Appearance: Normal appearance. He is not ill-appearing, toxic-appearing or diaphoretic.  HENT:     Head: Normocephalic and atraumatic.     Right Ear: External ear normal.     Left Ear: External ear normal.     Mouth/Throat:     Mouth: Mucous membranes are moist.     Pharynx: Oropharynx  is clear. No oropharyngeal exudate or posterior oropharyngeal erythema.  Eyes:     General: No scleral icterus.       Right eye: No discharge.        Left eye: No discharge.     Conjunctiva/sclera: Conjunctivae normal.  Cardiovascular:     Rate and Rhythm: Normal rate and regular rhythm.  Pulmonary:     Effort: Pulmonary effort is normal.     Breath sounds: Normal breath sounds.  Abdominal:     General: Bowel sounds are normal.  Musculoskeletal:     Cervical back: No rigidity or tenderness.     Right hip: Normal.     Left hip: Normal.     Right upper leg: Normal.     Left upper leg: Normal.     Right lower leg: Normal.     Left lower leg: Normal.  Lymphadenopathy:     Cervical: No cervical adenopathy.  Skin:    General: Skin is warm and dry.  Neurological:     General: No focal deficit present.     Mental Status: He is alert and oriented to person, place, and time.     Motor: No weakness.  Psychiatric:        Mood and Affect: Mood normal.        Behavior: Behavior normal.    BP 116/74 (BP Location: Right Arm, Patient Position: Sitting, Cuff Size: Normal)   Pulse 64   Temp 97.6 F (36.4 C) (Temporal)   Ht 5\' 8"  (1.727 m)   Wt 184 lb (83.5 kg)   SpO2 98%   BMI 27.98 kg/m  Wt Readings from Last 3 Encounters:  12/12/20 184 lb (83.5 kg)  11/11/20 188 lb 3.2 oz (85.4 kg)  11/04/20 189 lb (85.7 kg)     Health Maintenance Due  Topic Date Due   TETANUS/TDAP  Never done    There are no preventive care reminders to display for this patient.  Lab Results  Component Value Date   TSH 2.59 11/07/2020   Lab Results  Component Value Date   WBC 4.3 08/05/2018   HGB 15.0 08/05/2018   HCT 42.1 08/05/2018   MCV 84.6 08/05/2018   PLT 165.0 08/05/2018   Lab Results  Component Value Date   NA 139 04/15/2020   K 4.0 04/15/2020   CO2 28 04/15/2020   GLUCOSE 89 04/15/2020   BUN 17 04/15/2020   CREATININE 0.86 04/15/2020   BILITOT 0.6 08/05/2018   ALKPHOS 64  08/05/2018   AST 21 04/15/2020   ALT 22 04/15/2020  PROT 7.0 08/05/2018   ALBUMIN 4.7 08/05/2018   CALCIUM 10.0 04/15/2020   GFR 113.45 04/15/2020   Lab Results  Component Value Date   CHOL 193 04/15/2020   Lab Results  Component Value Date   HDL 63.50 04/15/2020   Lab Results  Component Value Date   LDLCALC 109 (H) 04/15/2020   Lab Results  Component Value Date   TRIG 101.0 04/15/2020   Lab Results  Component Value Date   CHOLHDL 3 04/15/2020   Lab Results  Component Value Date   HGBA1C 5.4 04/15/2020      Assessment & Plan:   Problem List Items Addressed This Visit       Digestive   Constipation by delayed colonic transit     Other   Anxiety with depression - Primary   Relevant Medications   FLUoxetine (PROZAC) 20 MG capsule   Myalgia   Relevant Orders   CBC   Comprehensive metabolic panel   CK isoenzymes (brain, muscle injury)    Meds ordered this encounter  Medications   FLUoxetine (PROZAC) 20 MG capsule    Sig: Take 1 capsule (20 mg total) by mouth daily.    Dispense:  90 capsule    Refill:  3    Follow-up: Return in about 6 months (around 06/12/2021), or if symptoms worsen or fail to improve.  Continue fluoxetine.  Given information on constipation.  Encouraged exercise.  He may run if he tolerates that if not he can walk.    Mliss Sax, MD

## 2020-12-13 ENCOUNTER — Encounter: Payer: Managed Care, Other (non HMO) | Admitting: Neurology

## 2020-12-16 LAB — CK ISOENZYMES
CK-BB: NOT DETECTED % of total
CK-MB: 0 % of total (ref <5)
CK-MM: 100 % of total (ref 95–100)
Total CK: 63 U/L (ref 44–196)

## 2021-01-23 ENCOUNTER — Other Ambulatory Visit: Payer: Self-pay

## 2021-01-23 ENCOUNTER — Encounter: Payer: Self-pay | Admitting: Neurology

## 2021-01-23 ENCOUNTER — Ambulatory Visit: Payer: Managed Care, Other (non HMO) | Admitting: Neurology

## 2021-01-23 VITALS — BP 115/73 | HR 66 | Ht 68.0 in | Wt 186.4 lb

## 2021-01-23 DIAGNOSIS — R0683 Snoring: Secondary | ICD-10-CM

## 2021-01-23 DIAGNOSIS — R519 Headache, unspecified: Secondary | ICD-10-CM

## 2021-01-23 DIAGNOSIS — G4719 Other hypersomnia: Secondary | ICD-10-CM

## 2021-01-23 DIAGNOSIS — E663 Overweight: Secondary | ICD-10-CM

## 2021-01-23 DIAGNOSIS — R0681 Apnea, not elsewhere classified: Secondary | ICD-10-CM

## 2021-01-23 DIAGNOSIS — R351 Nocturia: Secondary | ICD-10-CM

## 2021-01-23 NOTE — Progress Notes (Deleted)
Snoring, Witness apnea and pt aware f wakes up not feeling able to breathe.  Feels tired even after sleeping.

## 2021-01-23 NOTE — Progress Notes (Signed)
Subjective:    Patient ID: Patrick Parsons is a 35 y.o. male.  HPI    Patrick Foley, MD, PhD [facility] [address] [address] [place], [place] [address]  Dear Dr. Doreene Burke,   I saw your patient, Patrick Parsons, upon your kind request, in my Sleep clinic today for initial consultation of his sleep disorder, in particular, concern for underlying obstructive sleep apnea.  The patient is unaccompanied today.  As you know, Patrick Parsons is a 35 year old right-handed gentleman with an underlying medical history of asthma, paresthesias and lower extremity weakness (followed by Dr. Allena Katz at [facility] neurology), and mildly overweight state, who reports snoring and excessive daytime somnolence as well as witnessed apneas per wife's report.  I reviewed your office note from 11/11/2020.  His Epworth sleepiness score is 7/24, fatigue severity score is 41 out of 63.  He does not wake up rested.  Sometimes he has trouble breathing when he lays supine even before he falls asleep.  His wife has noted pauses in his breathing and sometimes he wakes up with a sense of gasping for air.  He goes to bed generally around 10 or 11 PM and rise time is generally between 7 and 8 AM.  He does have nocturia, sometimes as many as 4 or 5 times per night, on average maybe 2-3 times per night.  He has woken up occasionally with a headache and takes Goody powder for headaches, up to once a week typically.  He is not aware of any family history of sleep apnea.  He denies any telltale symptoms of restless leg syndrome or leg twitching at night.  He does not have a TV in his bedroom.  He has no pets in the household and they are expecting their first child in March 2023.  He has caffeine in the form of coffee, 1 or 2 cups in the mornings, does not drink alcohol, is a non-smoker.  He works for [employer].  He has had some weight fluctuation.  He is trying to lose weight and is wondering if trying to do a keto diet has  escalated his headaches.  Of note, he does not take the Prozac, decided not to take it because of side effect concerns.   His Past Medical History Is Significant For: Past Medical History:  Diagnosis Date   Asthma     His Past Surgical History Is Significant For: History reviewed. No pertinent surgical history.  His Family History Is Significant For: Family History  Problem Relation Age of Onset   Allergic rhinitis Mother    Diabetes Father    Heart disease Father    Heart Problems Maternal Grandmother     His Social History Is Significant For: Social History   Socioeconomic History   Marital status: Married    Spouse name: Not on file   Number of children: Not on file   Years of education: Not on file   Highest education level: Not on file  Occupational History   Not on file  Tobacco Use   Smoking status: Never   Smokeless tobacco: Never  Vaping Use   Vaping Use: Never used  Substance and Sexual Activity   Alcohol use: Never   Drug use: Never   Sexual activity: Yes  Other Topics Concern   Not on file  Social History Narrative   Right Handed    Does not have any children yet    Lives in a one story home  in an apartment complex   Caffeine 1-2 cups am daily   Education: Master Degree   Work- Emergency planning/management officer.    Social Determinants of Health   Financial Resource Strain: Not on file  Food Insecurity: Not on file  Transportation Needs: Not on file  Physical Activity: Not on file  Stress: Not on file  Social Connections: Not on file   His Allergies Are:  No Known Allergies:   His Current Medications Are:  Outpatient Encounter Medications as of 01/23/2021  Medication Sig   FLUoxetine (PROZAC) 20 MG capsule Take 1 capsule (20 mg total) by mouth daily.   sulfamethoxazole-trimethoprim (BACTRIM DS) 800-160 MG tablet Take 1 tablet by mouth 2 (two) times daily.   No facility-administered encounter medications on file as of 01/23/2021.  :   Review of  Systems:  Out of a complete 14 point review of systems, all are reviewed and negative with the exception of these symptoms as listed below:  Review of Systems  Neurological:        Snoring, Witness apnea and pt aware f wakes up not feeling able to breathe.  Feels tired even after sleeping. ESS 7, FSS-41   Objective:  Neurological Exam  Physical Exam Physical Examination:   Vitals:   01/23/21 1523  BP: 115/73  Pulse: 66    General Examination: The patient is a very pleasant 35 y.o. male in no acute distress. He appears well-developed and well-nourished and well groomed.   HEENT: Normocephalic, atraumatic, pupils are equal, round and reactive to light, extraocular tracking is good without limitation to gaze excursion or nystagmus noted. Hearing is grossly intact. Face is symmetric with normal facial animation. Speech is clear with no dysarthria noted. There is no hypophonia. There is no lip, neck/head, jaw or voice tremor. Neck is supple with full range of passive and active motion. There are no carotid bruits on auscultation. Oropharynx exam reveals: mild mouth dryness, good dental hygiene and mild airway crowding, due to a smaller airway entry, tonsils small, uvula small. Mallampati is class III. Tongue protrudes centrally and palate elevates symmetrically. Tonsils are small or perhaps absent?  He recalls no tonsillectomy.  Neck circumference is 15-7/8 inches.  He has a mild overbite.  Chest: Clear to auscultation without wheezing, rhonchi or crackles noted.  Heart: S1+S2+0, regular and normal without murmurs, rubs or gallops noted.   Abdomen: Soft, non-tender and non-distended with normal bowel sounds appreciated on auscultation.  Extremities: There is no pitting edema in the distal lower extremities bilaterally.   Skin: Warm and dry without trophic changes noted.   Musculoskeletal: exam reveals no obvious joint deformities, tenderness or joint swelling or erythema.    Neurologically:  Mental status: The patient is awake, alert and oriented in all 4 spheres. His immediate and remote memory, attention, language skills and fund of knowledge are appropriate. There is no evidence of aphasia, agnosia, apraxia or anomia. Speech is clear with normal prosody and enunciation. Thought process is linear. Mood is normal and affect is normal.  Cranial nerves II - XII are as described above under HEENT exam.  Motor exam: Normal bulk, strength and tone is noted. There is no tremor, fine motor skills and coordination: grossly intact.  Cerebellar testing: No dysmetria or intention tremor. There is no truncal or gait ataxia.  Sensory exam: intact to light touch in the upper and lower extremities.  Gait, station and balance: He stands easily. No veering to one side is noted. No leaning to one  side is noted. Posture is age-appropriate and stance is narrow based. Gait shows normal stride length and normal pace. No problems turning are noted.   Assessment and Plan:  In summary, Baldemar Dady is a very pleasant 35 y.o.-year old male with an underlying medical history of asthma, paresthesias and lower extremity weakness (followed by Dr. Allena Katz at [facility] neurology), and mildly overweight state, whose history and physical exam are concerning for obstructive sleep apnea (OSA). I had a long chat with the patient about my findings and the diagnosis of OSA, its prognosis and treatment options. We talked about medical treatments, surgical interventions and non-pharmacological approaches. I explained in particular the risks and ramifications of untreated moderate to severe OSA, especially with respect to developing cardiovascular disease down the Road, including congestive heart failure, difficult to treat hypertension, cardiac arrhythmias, or stroke. Even type 2 diabetes has, in part, been linked to untreated OSA. Symptoms of untreated OSA include daytime sleepiness, memory problems, mood  irritability and mood disorder such as depression and anxiety, lack of energy, as well as recurrent headaches, especially morning headaches. We talked about trying to maintain a healthy lifestyle in general, as well as the importance of weight control. We also talked about the importance of good sleep hygiene. I recommended the following at this time: sleep study. I outlined the difference between a laboratory attended sleep study versus home sleep test. I explained the sleep test procedure to the patient and also outlined possible surgical and non-surgical treatment options of OSA, including the use of a custom-made dental device (which would require a referral to a specialist dentist or oral surgeon), upper airway surgical options, such as traditional UPPP or a novel less invasive surgical option in the form of Inspire hypoglossal nerve stimulation (which would involve a referral to an ENT surgeon). I also explained the CPAP treatment option to the patient, who indicated that he would be willing to try CPAP if the need arises.  We will pick up our discussion after testing and keep him posted as to his test results by phone call as well.  We will make a follow-up appointment in this clinic accordingly.  I answered all his questions today and he was in agreement.   Thank you very much for allowing me to participate in the care of this nice patient. If I can be of any further assistance to you please do not hesitate to call me at [phone].  Sincerely,   Patrick Foley, MD, PhD

## 2021-01-23 NOTE — Patient Instructions (Signed)

## 2021-02-10 ENCOUNTER — Encounter: Payer: Self-pay | Admitting: Family Medicine

## 2021-02-10 MED ORDER — OXYBUTYNIN CHLORIDE ER 5 MG PO TB24: 5.0000 mg | ORAL_TABLET | Freq: Every day | ORAL | 2 refills | Status: AC

## 2021-02-17 ENCOUNTER — Other Ambulatory Visit: Payer: Self-pay | Admitting: Family Medicine

## 2021-02-20 NOTE — Telephone Encounter (Signed)
Called and explained to pt he will need an appointment to address reason for antibiotic being prescribed. Pt advised to go to walk-in clinic or urgent care for further evaluation due to limited availability on pcp appt schedule. Pt voiced understanding.

## 2021-03-21 ENCOUNTER — Telehealth: Payer: Self-pay | Admitting: Neurology

## 2021-03-21 NOTE — Telephone Encounter (Signed)
LVM for pt to call me back to schedule sleep study  

## 2021-03-24 ENCOUNTER — Ambulatory Visit: Payer: Managed Care, Other (non HMO) | Admitting: Family Medicine

## 2021-03-29 ENCOUNTER — Ambulatory Visit (INDEPENDENT_AMBULATORY_CARE_PROVIDER_SITE_OTHER): Payer: Managed Care, Other (non HMO) | Admitting: Neurology

## 2021-03-29 DIAGNOSIS — G4733 Obstructive sleep apnea (adult) (pediatric): Secondary | ICD-10-CM

## 2021-03-29 DIAGNOSIS — G4719 Other hypersomnia: Secondary | ICD-10-CM

## 2021-03-29 DIAGNOSIS — R519 Headache, unspecified: Secondary | ICD-10-CM

## 2021-03-29 DIAGNOSIS — R0683 Snoring: Secondary | ICD-10-CM

## 2021-03-29 DIAGNOSIS — R0681 Apnea, not elsewhere classified: Secondary | ICD-10-CM

## 2021-03-29 DIAGNOSIS — R351 Nocturia: Secondary | ICD-10-CM

## 2021-03-29 DIAGNOSIS — E663 Overweight: Secondary | ICD-10-CM

## 2021-03-30 NOTE — Progress Notes (Signed)
° °  [facility]  HOME SLEEP TEST (Watch PAT) REPORT  STUDY DATE: 03/29/2021  DOB: January 09, 1986  MRN: [id number]  ORDERING CLINICIAN: Star Age, MD, PhD   REFERRING CLINICIAN: Libby Maw, MD   CLINICAL INFORMATION/HISTORY:  36 year old gentleman with an underlying medical history of asthma, paresthesias and lower extremity weakness, and mildly overweight state, who reports snoring and excessive daytime somnolence as well as witnessed apneas per wife's report.   Epworth sleepiness score: 7/24.  BMI: 28.1 kg/m  FINDINGS:   Sleep Summary:   Total Recording Time (hours, min): 8 hours, 26 minutes  Total Sleep Time (hours, min):  7 hours, 0 minutes   Percent REM (%):    22.5%   Respiratory Indices:   Calculated pAHI (per hour):  5.6/hour         REM pAHI:    14/hour       NREM pAHI: 3.1/hour  Oxygen Saturation Statistics:    Oxygen Saturation (%) Mean: 94%   Minimum oxygen saturation (%):                 92%   O2 Saturation Range (%): 92-98%    O2 Saturation (minutes) <=88%: 0 min  Pulse Rate Statistics:   Pulse Mean (bpm):    63/min    Pulse Range (47-100/min)   IMPRESSION: OSA (obstructive sleep apnea), mild  RECOMMENDATION:  This HST shows borderline obstructive sleep apnea, with an AHI of 5.6/hour and O2 nadir of 92%.  Mild intermittent snoring was noted.  Treatment options include avoidance of the supine sleep position along with weight loss, and a dental device for particularly disturbing snoring and positive airway pressure with an autoPAP, if desired by patient. These different avenues will be discussed with the patient. The patient will be seen in follow up in sleep clinic, if necessary. Please note, that other causes of the patient's symptoms, including circadian rhythm disturbances, an underlying mood disorder, medication effect and/or an underlying medical problem cannot be ruled out based on this test. Clinical correlation  is recommended. The patient should be cautioned not to drive, work at heights, or operate dangerous or heavy equipment when tired or sleepy. Review and reiteration of good sleep hygiene measures should be pursued with any patient. The referring provider will be notified of the test results.   I certify that I have reviewed the raw data recording prior to the issuance of this report in accordance with the standards of the American Academy of Sleep Medicine (AASM).   INTERPRETING PHYSICIAN:   Star Age, MD, PhD  Board Certified in Neurology and Sleep Medicine  [facility] [address] [place], [place] [address] [phone]

## 2021-04-03 ENCOUNTER — Encounter: Payer: Self-pay | Admitting: Neurology

## 2021-04-03 DIAGNOSIS — G4733 Obstructive sleep apnea (adult) (pediatric): Secondary | ICD-10-CM

## 2021-04-03 NOTE — Procedures (Signed)
° °  [facility] ° °HOME SLEEP TEST (Watch PAT) REPORT ° °STUDY DATE: 03/29/2021 ° °DOB: 10/26/1985 ° °MRN: [id number] ° °ORDERING CLINICIAN: Mister Krahenbuhl, MD, PhD °  °REFERRING CLINICIAN: Kremer, William Alfred, MD  ° °CLINICAL INFORMATION/HISTORY:  35-year-old gentleman with an underlying medical history of asthma, paresthesias and lower extremity weakness, and mildly overweight state, who reports snoring and excessive daytime somnolence as well as witnessed apneas per wife's report.  ° °Epworth sleepiness score: 7/24. ° °BMI: 28.1 kg/m² ° °FINDINGS:  ° °Sleep Summary:  ° °Total Recording Time (hours, min): 8 hours, 26 minutes ° °Total Sleep Time (hours, min):  7 hours, 0 minutes  ° °Percent REM (%):    22.5%  ° °Respiratory Indices:  ° °Calculated pAHI (per hour):  5.6/hour        ° °REM pAHI:    14/hour      ° °NREM pAHI: 3.1/hour ° °Oxygen Saturation Statistics:  °  °Oxygen Saturation (%) Mean: 94%  ° °Minimum oxygen saturation (%):                 92%  ° °O2 Saturation Range (%): 92-98%   ° °O2 Saturation (minutes) <=88%: 0 min ° °Pulse Rate Statistics:  ° °Pulse Mean (bpm):    63/min   ° °Pulse Range (47-100/min)  ° °IMPRESSION: OSA (obstructive sleep apnea), mild ° °RECOMMENDATION:  °This HST shows borderline obstructive sleep apnea, with an AHI of 5.6/hour and O2 nadir of 92%.  Mild intermittent snoring was noted.  Treatment options include avoidance of the supine sleep position along with weight loss, and a dental device for particularly disturbing snoring and positive airway pressure with an autoPAP, if desired by patient. These different avenues will be discussed with the patient. The patient will be seen in follow up in sleep clinic, if necessary. Please note, that other causes of the patient's symptoms, including circadian rhythm disturbances, an underlying mood disorder, medication effect and/or an underlying medical problem cannot be ruled out based on this test. Clinical correlation  is recommended. The patient should be cautioned not to drive, work at heights, or operate dangerous or heavy equipment when tired or sleepy. Review and reiteration of good sleep hygiene measures should be pursued with any patient. The referring provider will be notified of the test results.  ° °I certify that I have reviewed the raw data recording prior to the issuance of this report in accordance with the standards of the American Academy of Sleep Medicine (AASM). ° ° °INTERPRETING PHYSICIAN:  ° °Alon Mazor, MD, PhD  °Board Certified in Neurology and Sleep Medicine ° °[facility] °[address], [address] °[place], [place] [address] °[phone] ° ° ° ° ° ° ° ° ° ° ° ° °

## 2021-04-04 NOTE — Telephone Encounter (Signed)
Huston Foley, MD  04/03/2021  5:42 PM EST     Patient referred by Dr. Doreene Burke for concern for sleep apnea, seen by me on 01/23/2021.  He had a home sleep test on 03/30/2021. Please call and notify the patient that the recent home sleep test showed mild/borderline obstructive sleep apnea.  Overall AHI was 5.6/h, oxygen remained at or above 92% for the night.  Mild intermittent snoring was detected.  Treatment options include weight loss and avoiding sleeping on the back or a dental device through dentistry or treatment with a machine called AutoPap.  I will say that we typically do not recommend treatment for this mild degree of sleep apnea and it may not even be covered by insurance but if he wishes to try an AutoPap machine, I would be happy to prescribe 1 and we can see if insurance would cover it.  The DME company would obtain insurance authorization and get him to get the machine.  If he would like to consider a dental device, we can make a referral to dentistry. Let me know how he would like to proceed. Huston Foley, MD, PhD [facility])   Results forwarded to Dr Doreene Burke (PCP).

## 2021-04-04 NOTE — Addendum Note (Signed)
Addended by: Huston Foley on: 04/04/2021 01:52 PM   Modules accepted: Orders

## 2021-04-04 NOTE — Telephone Encounter (Signed)
Noted. Order, sleep study, insurance, office note all faxed to [place].Received a receipt of confirmation.

## 2021-04-04 NOTE — Telephone Encounter (Signed)
AutoPap ordered, DME we will have to check insurance coverage for treatment.

## 2021-04-18 ENCOUNTER — Ambulatory Visit: Payer: Managed Care, Other (non HMO) | Admitting: Family Medicine

## 2021-05-15 ENCOUNTER — Telehealth: Payer: Self-pay | Admitting: Family Medicine

## 2021-05-15 ENCOUNTER — Ambulatory Visit: Payer: Managed Care, Other (non HMO) | Admitting: Family Medicine

## 2021-05-15 NOTE — Telephone Encounter (Signed)
Patient/Caregiver was notified of No Show/Late Cancellation Policy & possible $50 charge. ?Visit was cancelled with reason "No Show/Cancel within 24 hours" for tracking & charging. ? ?Caller Name: margues filippini  ?Caller Ph #: [phone] ?Date of APPT: 05/15/21 ?Reason given for no show/late cancellation: no reason no show ?No Show Letter printed & put in outgoing mail (Yes/No): yes ? ?~~~Route message to admin supervisor and clinical team/CMA~~~ ? ? ? ?

## 2021-05-18 ENCOUNTER — Telehealth: Payer: Self-pay | Admitting: Family Medicine

## 2021-05-18 ENCOUNTER — Ambulatory Visit: Payer: Managed Care, Other (non HMO) | Admitting: Family Medicine

## 2021-05-18 NOTE — Telephone Encounter (Signed)
Patient/Caregiver was notified of No Show/Late Cancellation Policy & possible $50 charge. ?Visit was cancelled with reason "No Show/Cancel within 24 hours" for tracking & charging. ? ?Caller Name: Rebekah Sprinkle ?Caller Ph #: [phone] ?Date of APPT: 05/18/21 ?Reason given for no show/late cancellation: no reason no show ?No Show Letter printed & put in outgoing mail (Yes/No): yes ? ?~~~Route message to admin supervisor and clinical team/CMA~~~ ? ?  ?

## 2021-05-23 NOTE — Telephone Encounter (Signed)
2nd no show, fee generated 

## 2021-05-23 NOTE — Telephone Encounter (Signed)
1st no show, fee waived ?

## 2021-05-29 ENCOUNTER — Telehealth: Payer: Self-pay | Admitting: Family Medicine

## 2021-05-29 ENCOUNTER — Ambulatory Visit: Payer: Managed Care, Other (non HMO) | Admitting: Family Medicine

## 2021-05-29 NOTE — Telephone Encounter (Signed)
Pt did not show up for his OV app with Dr. Doreene Burke on 05/29/21, I sent a no show letter. ?

## 2021-06-07 ENCOUNTER — Encounter: Payer: Self-pay | Admitting: Family Medicine

## 2021-06-07 ENCOUNTER — Telehealth: Payer: Self-pay | Admitting: Neurology

## 2021-06-07 NOTE — Telephone Encounter (Signed)
Pt has missed 3 visits and no show letters were sent previously. Dismissal letter generated today. Fee generated for $50 for DOS 05/29/2021.  ? ?

## 2021-06-07 NOTE — Telephone Encounter (Signed)
Pt called stating that he is wanting to know if there is an alternative to using the cpap. Pt states he has not been using it and is stating that he does not see the point of coming to the appt if he is not using the machine. Pt is needing to speak to the RN before the appt date. ?

## 2021-06-07 NOTE — Telephone Encounter (Signed)
I called pt and had to [facility] for him that would continue with the upcoming appt.  I assumed he was on machine, but after speaking with [facility] they made several appts but he no showed them.  So does he have machine, where did he get??   ?

## 2021-06-08 NOTE — Telephone Encounter (Addendum)
I called pt he did not get machine as the autopap was to expensive for him.  He is also changing jobs and will not have insurance next week, but will call us back in May 2023 when insurance will be made available for him. His sleep study results were as below.  I relayed if he wants to proceed further after new insurance to let us know or if wants referral for dental device to let us know.  He appreciated call back.  I will cancel the appt that was scheduled for initial cpap. Pt is aware of plan.  ?"The recent home sleep test showed mild/borderline obstructive sleep apnea.  Overall AHI was 5.6/h, oxygen remained at or above 92% for the night.  Mild intermittent snoring was detected.  Treatment options include weight loss and avoiding sleeping on the back or a dental device through dentistry or treatment with a machine called AutoPap.  I will say that we typically do not recommend treatment for this mild degree of sleep apnea and it may not even be covered by insurance but if he wishes to try an AutoPap machine, I would be happy to prescribe 1 and we can see if insurance would cover it.  The DME company would obtain insurance authorization and get him to get the machine.  If he would like to consider a dental device, we can make a referral to dentistry. ?Let me know how he would like to proceed."   per Huston Foley, MD, PhD ?

## 2021-06-09 ENCOUNTER — Encounter: Payer: Managed Care, Other (non HMO) | Admitting: Family Medicine

## 2021-06-09 ENCOUNTER — Ambulatory Visit: Payer: Managed Care, Other (non HMO) | Admitting: Family Medicine

## 2021-06-12 ENCOUNTER — Ambulatory Visit: Payer: Managed Care, Other (non HMO) | Admitting: Neurology

## 2021-06-12 ENCOUNTER — Ambulatory Visit: Payer: Managed Care, Other (non HMO) | Admitting: Family Medicine

## 2021-07-14 ENCOUNTER — Ambulatory Visit: Payer: Managed Care, Other (non HMO) | Admitting: Family Medicine

## 2021-07-31 ENCOUNTER — Ambulatory Visit (INDEPENDENT_AMBULATORY_CARE_PROVIDER_SITE_OTHER): Payer: Self-pay | Admitting: Family Medicine

## 2021-08-28 ENCOUNTER — Ambulatory Visit: Payer: Managed Care, Other (non HMO) | Admitting: Neurology

## 2021-09-13 ENCOUNTER — Ambulatory Visit (INDEPENDENT_AMBULATORY_CARE_PROVIDER_SITE_OTHER): Payer: Self-pay | Admitting: Family Medicine

## 2021-09-26 ENCOUNTER — Emergency Department
Admission: EM | Admit: 2021-09-26 | Discharge: 2021-09-26 | Disposition: A | Discharge status: Home / Self Care | Payer: 59 | Attending: Physician Assistant | Admitting: Physician Assistant

## 2021-09-26 DIAGNOSIS — Z20822 Contact with and (suspected) exposure to covid-19: Secondary | ICD-10-CM | POA: Insufficient documentation

## 2021-09-26 DIAGNOSIS — J069 Acute upper respiratory infection, unspecified: Secondary | ICD-10-CM | POA: Insufficient documentation

## 2021-09-26 DIAGNOSIS — R0981 Nasal congestion: Secondary | ICD-10-CM | POA: Insufficient documentation

## 2021-09-26 DIAGNOSIS — J029 Acute pharyngitis, unspecified: Secondary | ICD-10-CM | POA: Insufficient documentation

## 2021-09-26 DIAGNOSIS — R059 Cough, unspecified: Secondary | ICD-10-CM | POA: Insufficient documentation

## 2021-09-26 LAB — VH XPERT XPRESS © COV-2/FLU/RSV PLUS
Date of Onset: 20230716
Does patient reside in a congregate care setting?: NEGATIVE
Influenza A RNA: NEGATIVE
Influenza B RNA: NEGATIVE
Is patient employed in a healthcare setting?: NEGATIVE
Is the patient pregnant?: NEGATIVE
RSV RNA: NEGATIVE
SARS-CoV-2 RNA: NEGATIVE

## 2021-09-26 LAB — VH CULTURE, THROAT

## 2021-09-26 LAB — VH STREP A RAPID TEST: Strep A, Rapid: NEGATIVE

## 2021-09-26 MED ORDER — VH GUAIFENESIN-DM 100-10 MG/5ML PO LIQUID (WRAP)
5.0000 mL | Freq: Once | ORAL | Status: AC
Start: 2021-09-26 — End: 2021-09-26
  Administered 2021-09-26: 5 mL via ORAL

## 2021-09-26 MED ORDER — VH GUAIFENESIN-DM 100-10 MG/5ML PO LIQUID (WRAP)
ORAL | Status: AC
Start: 2021-09-26 — End: ?
  Filled 2021-09-26: qty 5

## 2021-09-26 MED ORDER — PROMETHAZINE-DM 6.25-15 MG/5ML PO SYRP
5.0000 mL | ORAL_SOLUTION | Freq: Four times a day (QID) | ORAL | 0 refills | Status: DC | PRN
Start: 2021-09-26 — End: 2021-10-14

## 2021-09-26 NOTE — ED Triage Notes (Signed)
Patient to room E53/E53-A    Spencer Sanchez is a 36 y.o. male presenting to the ED for Cough    The patient arrived by private car and is accompanied by  family .    Nursing (triage) note reviewed for the following pertinent information:  Pt reports cough, sore throat started two days ago, then lost voice today. Denies fever, chills.

## 2021-09-26 NOTE — Discharge Instructions (Signed)
Your COVID flu RSV are all negative, your strep throat testing is all negative.  I have sent in cough medicine to your pharmacy

## 2021-09-26 NOTE — ED Provider Notes (Signed)
[facility]  EMERGENCY DEPARTMENT  History and Physical Exam     Patient Name: Spencer Sanchez, Spencer Sanchez  Encounter Date:  09/26/2021  Physician Assistant: Rico Junker, PA  Attending Physician: Dimple Casey, MD  Room:  E53/E53-A  Patient DOB:  [birth year]  Age: 36 y.o. male  MRN:  [id number]  PCP: Marisa Sprinkles, MD      Diagnosis/Disposition:  ED COURSE/MDM:     Final Impression  1. Viral URI with cough      Disposition  ED Disposition       ED Disposition   Discharge    Condition   --    Date/Time   Tue Sep 26, 2021 10:58 PM    Comment   Rodolph Bong discharge to home/self care.    Condition at disposition: Stable               Follow up  No follow-up provider specified.  Prescriptions  Discharge Medication List as of 09/26/2021 10:59 PM        START taking these medications    Details   promethazine-dextromethorphan (PROMETHAZINE-DM) 6.25-15 MG/5ML syrup Take 5 mLs by mouth 4 (four) times daily as needed for Cough, Starting Tue 09/26/2021, E-Rx                   Old medical records and nursing/triage notes were reviewed by myself.      Chief complaint: Cough      Differential Diagnosis Considered: COVID, flu, RSV, bronchitis,    All tests were ordered by me and are noted below. The results were independently reviewed and interpreted by myself. The significant findings are discussed in ED course below. If an EKG was obtained, my independent interpretation is noted below      Meds Given In ED: (Red Colored Meds indicate High Risk Medications that Required Monitoring for adverse effects or deterioration:    Medications   guaiFENesin-dextromethorphan (ROBITUSSIN DM) 100-10 MG/5ML oral liquid 5 mL (5 mLs Oral Given 09/26/21 2241)            Discussion:    The patient's presentation is suggestive of a viral URI. The patient is clinically well appearing without dyspnea and with normal work of breathing. He has lost his voice starting earlier today. Serious or potentially life-threatening causes of shortness of breath  like pneumonia with significant hypoxemia and/or sepsis, pneumothorax, cancer, or cardiac pathology were considered in the differential but seem unlikely. The patient appears appropriate for outpatient management with symptomatic treatment and primary care physician follow-up.  Patient had a negative strep test, negative COVID flu RSV.  He Is clear to auscultation bilaterally.  He had normal vital signs, he was afebrile.  He is given 1 dose of Robitussin in the emergency department and discharged with Promethazine DM at home.  His infant is also sick with similar symptoms.  We discussed that this is most likely a viral URI.  We discussed that his symptoms should improve over the next 7 to 10 days.      Warned to return immediately for worsening symptoms or any concerns .Patient was well-appearing at time of disposition.  Diagnostic impression and plan were discussed with the patient and/or family.  If ordered the results of lab/radiology tests were discussed with the patient and/or family. All questions were answered and concerns addressed.      All questions and concerns answered.  Patient appreciative of care.  Red flags were discussed with patient and/or family.  Patient understands  to immediately return to the ED if they began to develop new or worsening red flag symptoms    Discussions regarding hospitalization or transfer: No    Discussion with Consultants:     MDM Complexity:    History obtained from:Patient  HPI/ROS is limited by: none    Past Medical Records reviewed: Previous [place] ED Notes    Other Medical Conditions that Impact Care (Highlighted in RED):   PMHx: History reviewed. No pertinent past medical history.    Procedures: No See Procedure Section below for procedure note for details    Prescription Management: See Diagnosis/Disposition area for Prescription Management details    Social Determinants of Health that Impact Care: None that negatively impact care                       In addition  to the above history, please see nursing notes. Allergies, meds, past medical, family, social hx, and the results of the diagnostic studies performed have been reviewed by myself.    This chart was generated by an EMR and may contain errors or omissions not intended by the user.      History of Presenting Illness:     Nurse Triage: Pt reports cough, sore throat started two days ago, then lost voice today. Denies fever, chills.    Chief complaint: Cough    HPI/ROS given by: patient    Spencer Sanchez is a 36 y.o. male reports the emergency department for evaluation of cough x2 days associated with sore throat.  He is concerned as he lost his voice earlier this evening.  He had a runny nose.  He also states that his 72-month-old infant is also sick as well.  He denies any fevers chills chest pain or shortness of breath.  He has no significant past medical history.        Review of Systems:  Physical Exam:     Review of Systems   Constitutional:  Negative for chills, fever and malaise/fatigue.   HENT:  Positive for congestion. Negative for ear pain, sinus pain and sore throat.    Eyes:  Negative for pain, discharge and redness.   Respiratory:  Positive for cough. Negative for hemoptysis, shortness of breath and wheezing.    Cardiovascular:  Negative for chest pain.   Gastrointestinal:  Negative for abdominal pain and vomiting.   Musculoskeletal:  Negative for myalgias and neck pain.   Neurological:  Negative for dizziness, tremors and headaches.   Psychiatric/Behavioral:  Negative for depression.            Blood pressure 111/81, pulse 84, temperature 98.6 F (37 C), temperature source Temporal, resp. rate 18, height 1.75 m, weight 84.2 kg, SpO2 98 %.     Constitutional: WD/WN, active, NAD, Hoarse voice with NOS, no drooling, no trouble swallowing  Eyes: PERRL.  Sclera anicteric, no discharge.  Visual acuity grossly intact.    HENT  Head:  NCAT.   Ears: No external lesions.   Nose: No external lesions or discharge.     Oropharynx: clear, MMM.   Neck: Trachea midline. No JVD. Normal ROM. No apparent masses.  Respiratory: Effort normal without accessory muscle use. CTAB.  Cardiovascular: RRR, no MRG.  No appreciable peripheral edema. Brisk cap refill.  Abdomen: Soft, non-distended, non-tender. No palpable masses or hernias.  Genitourinary/rectal: Deferred   Musculoskeletal: Normal ROM. No deformity. Strength grossly intact throughout.  Neurological: Pt is alert. No focal motor deficits. Speech normal. CN  II-XII grossly normal.   Psychiatric: Affect appropriate. No agitation.   Skin: Warm, dry, well perfused. No rash noted.         PROCEDURES/EKG/CRITICAL CARE           EKG:   EKG Results       ** No results found for the last 48 hours. **                 Diagnostic Results:     LAB STUDIES    All lab value have been personally reviewed by me    Results       Procedure Component Value Units Date/Time    Respiratory Specimen Xpert Xpress  CoV-2 / Flu / RSV Plus [[id number]] Collected: 09/26/21 2131    Specimen: Nasopharyngeal Swab Updated: 09/26/21 2231     Influenza A RNA Negative     Influenza B RNA Negative     RSV RNA Negative     SARS-CoV-2 RNA Negative     Does patient have symptoms related to condition of interest? Y     Date of Onset [id number]     Is patient employed in a healthcare setting? N     Does patient reside in a congregate care setting? N     Is the patient pregnant? N    Narrative:      Specimen source - Nasopharyngeal Swab     Influenza A tests are unable to distinguish between novel and seasonal influenza A.     A negative result for either Influenza A or B does not exclude influenza virus infection. Clinical correlation required.     All positive influenza tests (A or B) require placement of patient on droplet precaution isolation.    Strep A Rapid Test [[id number]] Collected: 09/26/21 2131    Specimen: Throat Updated: 09/26/21 2218     Strep A, Rapid Negative            RADIOLOGIC STUDIES    All images have been  personally viewed by me    No results found.     ORDERS PLACED THIS VISIT     Orders  Orders Placed This Encounter   Procedures    Respiratory Specimen Xpert Xpress  CoV-2 / Flu / RSV Plus    Strep A Rapid Test    Throat Culture       Medications  Medications   guaiFENesin-dextromethorphan (ROBITUSSIN DM) 100-10 MG/5ML oral liquid 5 mL (5 mLs Oral Given 09/26/21 2241)              Allergies & Medications:     Pt has No Known Allergies.    Discharge Medication List as of 09/26/2021 10:59 PM              Past History:     Medical: History reviewed. No pertinent past medical history.    Surgical: History reviewed. No pertinent surgical history.    Family: History reviewed. No pertinent family history.    Social:  reports that he has never smoked. He has never used smokeless tobacco. He reports that he does not drink alcohol and does not use drugs.        ATTESTATIONS     Rico Junker, PA      Note:  This chart was generated by an EMR and speech recognition software and may contain errors, including typographical, or omissions not intended by the user. If there are questions or concerns about the content of this note  or information contained within the body of this dictation they should be addressed directly with the author for clarification.            Rico Junker, [place]  09/27/21 1656

## 2021-10-14 ENCOUNTER — Encounter (INDEPENDENT_AMBULATORY_CARE_PROVIDER_SITE_OTHER): Payer: Self-pay

## 2021-10-14 ENCOUNTER — Ambulatory Visit (INDEPENDENT_AMBULATORY_CARE_PROVIDER_SITE_OTHER): Payer: 59 | Admitting: Family

## 2021-10-14 VITALS — BP 115/76 | HR 74 | Temp 99.0°F | Resp 18 | Ht 68.9 in | Wt 183.0 lb

## 2021-10-14 DIAGNOSIS — R079 Chest pain, unspecified: Secondary | ICD-10-CM

## 2021-10-14 DIAGNOSIS — R059 Cough, unspecified: Secondary | ICD-10-CM

## 2021-10-14 DIAGNOSIS — M940 Chondrocostal junction syndrome [Tietze]: Secondary | ICD-10-CM

## 2021-10-14 DIAGNOSIS — R058 Other specified cough: Secondary | ICD-10-CM

## 2021-10-14 MED ORDER — BENZONATATE 100 MG PO CAPS
100.0000 mg | ORAL_CAPSULE | Freq: Three times a day (TID) | ORAL | 0 refills | Status: AC | PRN
Start: 2021-10-14 — End: 2021-10-24

## 2021-10-14 MED ORDER — PREDNISONE 20 MG PO TABS
20.0000 mg | ORAL_TABLET | Freq: Every day | ORAL | 0 refills | Status: AC
Start: 2021-10-14 — End: 2021-10-19

## 2021-10-14 NOTE — Progress Notes (Signed)
This provider utilized all appropriate PPE equipment during this visit Gloves, loop mask, Patient not wearing face mask.    All provider tools were cleaned with disinfectant solution before and after use on this patient.     Subjective:    Patient ID:   Spencer Sanchez is a 36 y.o. male who reports URI symptoms for 2-3 weeks and has treated Medrol dose pack and Amoxicillin and got better. He reports coughing is worse with AC on and noticing pain in chest for the last 3 days; seems to be more constant today. Patient he thought he might have slept on it wrong. He feels it worse on the left side. He reports last year he had  it checked out and all was good. complains. He denies a history of shortness of breath and admits to a history of asthma/lung disease. Patient denies smoke cigarettes. Evaluation to date: none. Treatment to date: yesterday he had a headache (goode's powder and tylenol). He went to the Urgent [facility] on [address] last time he was seen.     The following portions of the patient's history were reviewed and updated as appropriate: allergies, current medications, past medical history, past surgical history and problem list.    Review of Systems   Constitutional:  Negative for chills and fever.   Cardiovascular:  Positive for chest pain. Negative for palpitations.   Musculoskeletal:  Negative for myalgias.        Tenderness with palpation of the left side of chest wall; no visible bruising    Skin:  Negative for rash.       Objective:     Vitals:    10/14/21 1530   BP: 115/76   Pulse: 74   Resp: 18   Temp: 99 F (37.2 C)   SpO2: 96%       Physical Exam  Vitals reviewed.   HENT:      Head: Normocephalic and atraumatic.      Nose: No congestion or rhinorrhea.   Cardiovascular:      Rate and Rhythm: Normal rate.      Heart sounds: Normal heart sounds. No murmur heard.     No gallop.   Pulmonary:      Effort: Pulmonary effort is normal.      Breath sounds: Normal breath sounds. No stridor. No wheezing or rhonchi.    Musculoskeletal:         General: Tenderness present.      Comments: Tenderness around the left side of chest; palpation between ribs caused tenderness;    Neurological:      Mental Status: He is alert and oriented to person, place, and time.         Assessment and Plan:   Spencer Sanchez was seen today for cough.    Diagnoses and all orders for this visit:    Chest pain, unspecified type  -     ECG 12 lead; Standing  -     ECG 12 lead    Cough, unspecified type  -     ECG 12 lead; Standing  -     ECG 12 lead    Post-viral cough syndrome  -     benzonatate (Tessalon Perles) 100 MG capsule; Take 1 capsule (100 mg) by mouth 3 (three) times daily as needed for Cough    Costochondritis  -     predniSONE (DELTASONE) 20 MG tablet; Take 1 tablet (20 mg) by mouth daily for 5 days  Heart Score Calculator    0 points 1 point 2 points    History Slightly suspicious Moderately suspicious Highly suspicious 0   EKG Normal Non-specific repolarization disturbance Significant ST depression 0   Age (years) <45 45-64 ?65 0   Risk factors* No known risk factors 1-2 risk factors ?3 risk factors or history of atherosclerotic disease 0   Initial troponin ?normal limit 1-3 normal limit >3 normal limit Unable to collect       Total Score   0     *Risk factors: HTN, hypercholesterolemia, DM, obesity (BMI >30 kg/m), smoking (current, or smoking cessation ?3 month), positive family history (parent or sibling with CVD before age 75); atherosclerotic disease: prior MI, PCI/CABG, CVA/TIA, or peripheral arterial disease.  General Recommendations   (Some patients may require a more conservative plan of care)  Score 0 - 3:  Discharge and follow up with PCP. (Major Adverse Cardiac Event risk 2.5% over next six weeks)  Score 4 - 6: Observe for 6 hours and consider inpatient or early outpatient stress testing in the [facility]. (Major Adverse Cardiac Event risk 20.3% over next six weeks)  Score 7 - 10: Observe and consider in-hospital stress  testing or Cardiology consultation. (Major Adverse Cardiac Event risk 72.7% over next six weeks)  A Major Adverse Cardiac Event was defined as all-cause mortality, myocardial infarction, or coronary revascularization.     PLAN:    Likely post viral cough with costochondritis. I reviewed the ECG which reveals NSR. Heart score is 0. Also symptoms are present with palpation of muscles of chest. Recommend Prednisone, Voltaren gel, plenty of fluid, rest. The patient was counseled on possible medication/treatment side effects. Reviewed with patient red flags and when to seek medical care. Encouraged to read all pharmacy handouts and patient instructions.     Vital signs noted no acute management indicated at this time. Seek medical advice if they experience worsening symptoms and to follow up in 2-3 days if no improvement in symptoms. At the conclusion of the visit we reviewed diagnosis, treatment plan, diagnostic tests, prescriptions and follow up instructions pertaining to this visit. All questions and concerns regarding the current condition were addressed.      Gustavus Messing, FNP  [facility]  10/14/2021 4:38 PM

## 2021-10-14 NOTE — Progress Notes (Signed)
EKG performed by this LPN patient tolerated well and has no questions or concerns.

## 2021-10-24 IMAGING — DX DG LUMBAR SPINE COMPLETE 4+V
5 series · 5 of 5 positions shown · non-contrast
Comparison: None.

CLINICAL DATA: Intermittent low back pain.

EXAM:
LUMBAR SPINE - COMPLETE 4+ VIEW

[lumbar spine ap]
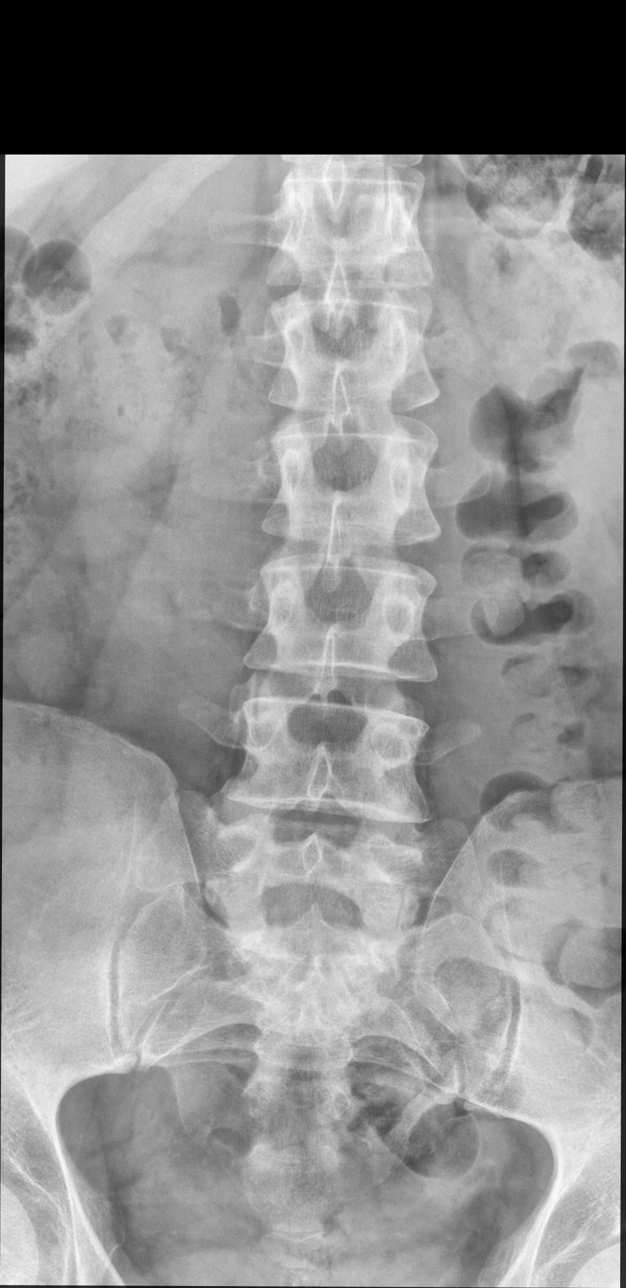

[lumbar spine lmo (1 of 2)]
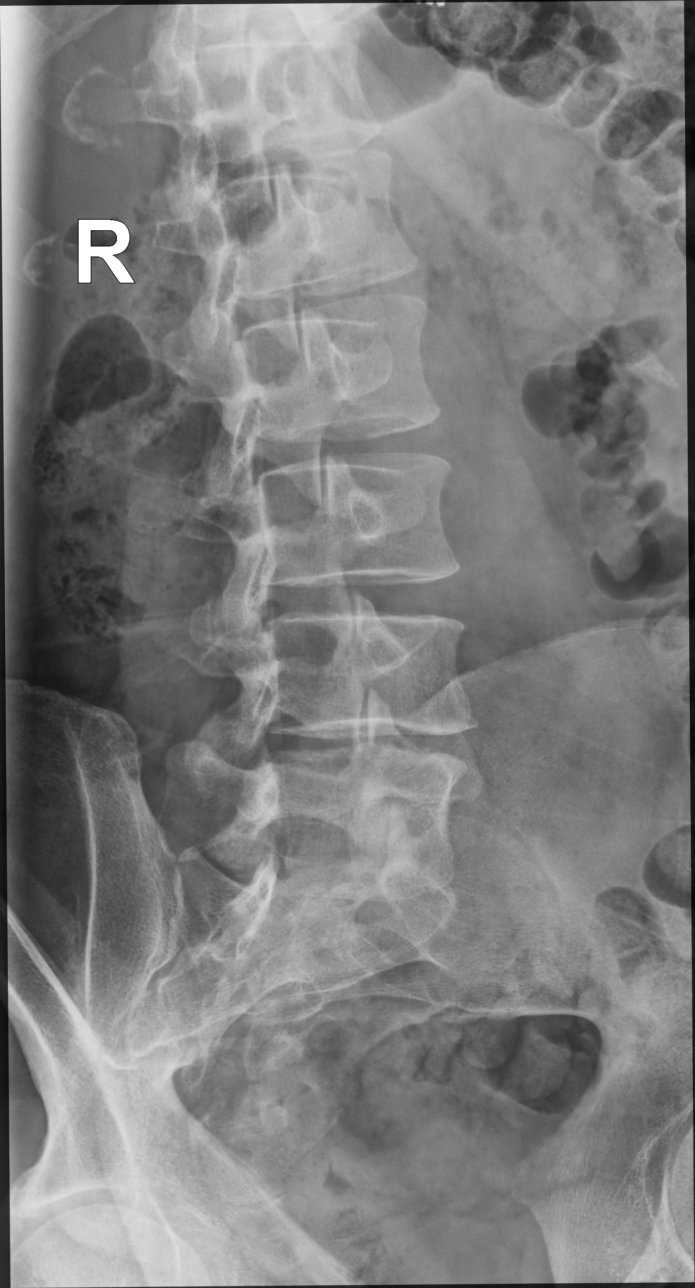

[lumbar spine lmo (2 of 2)]
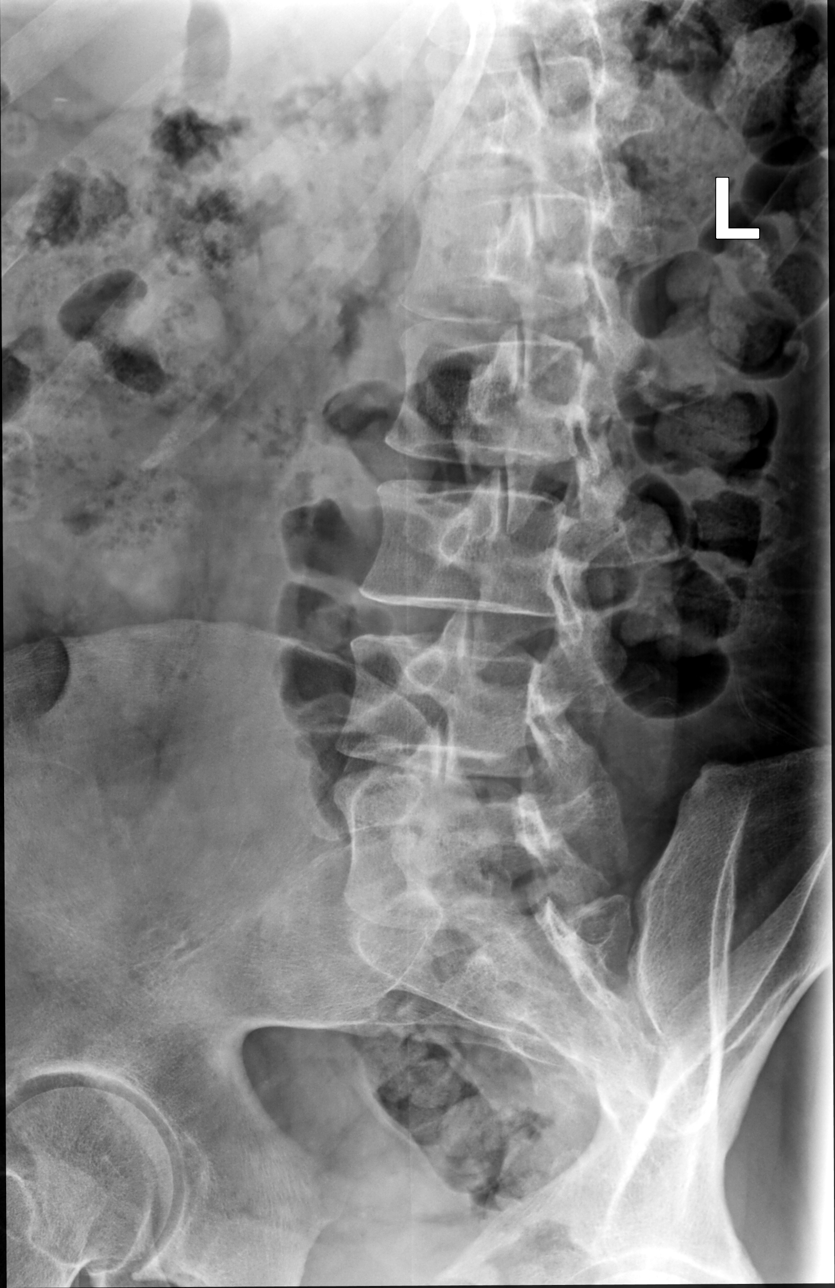

[lumbar spine lat (1 of 2)]
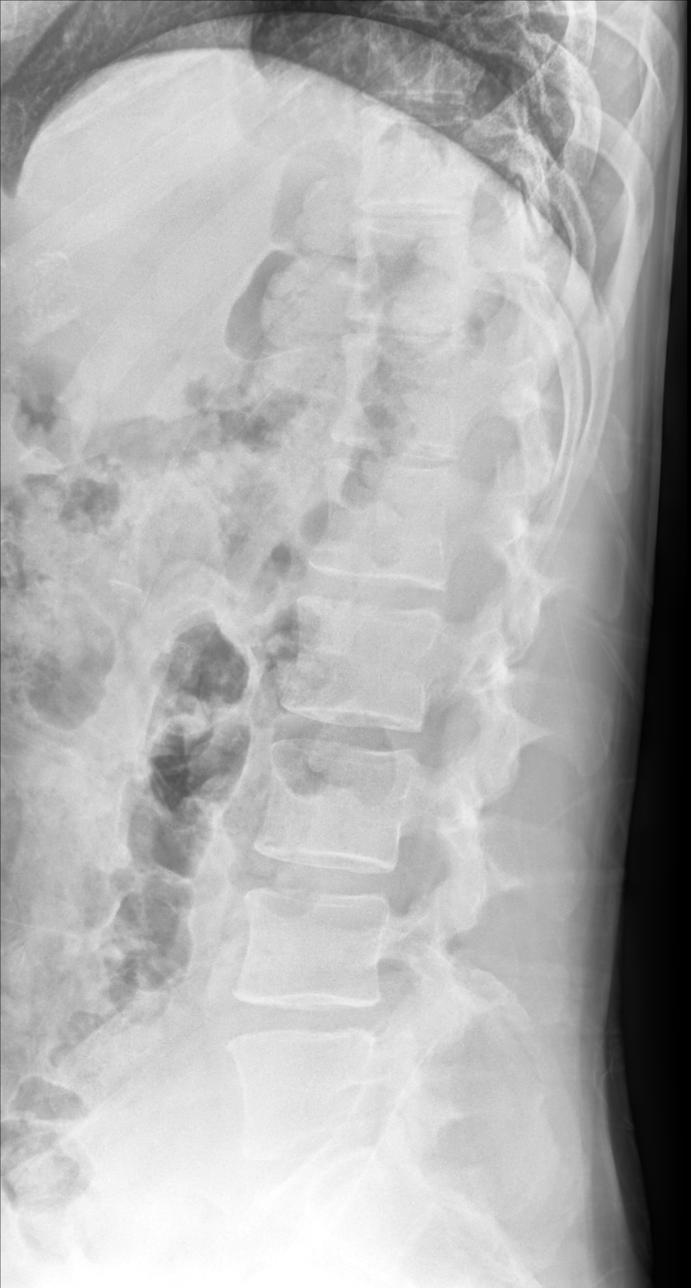

[lumbar spine lat (2 of 2)]
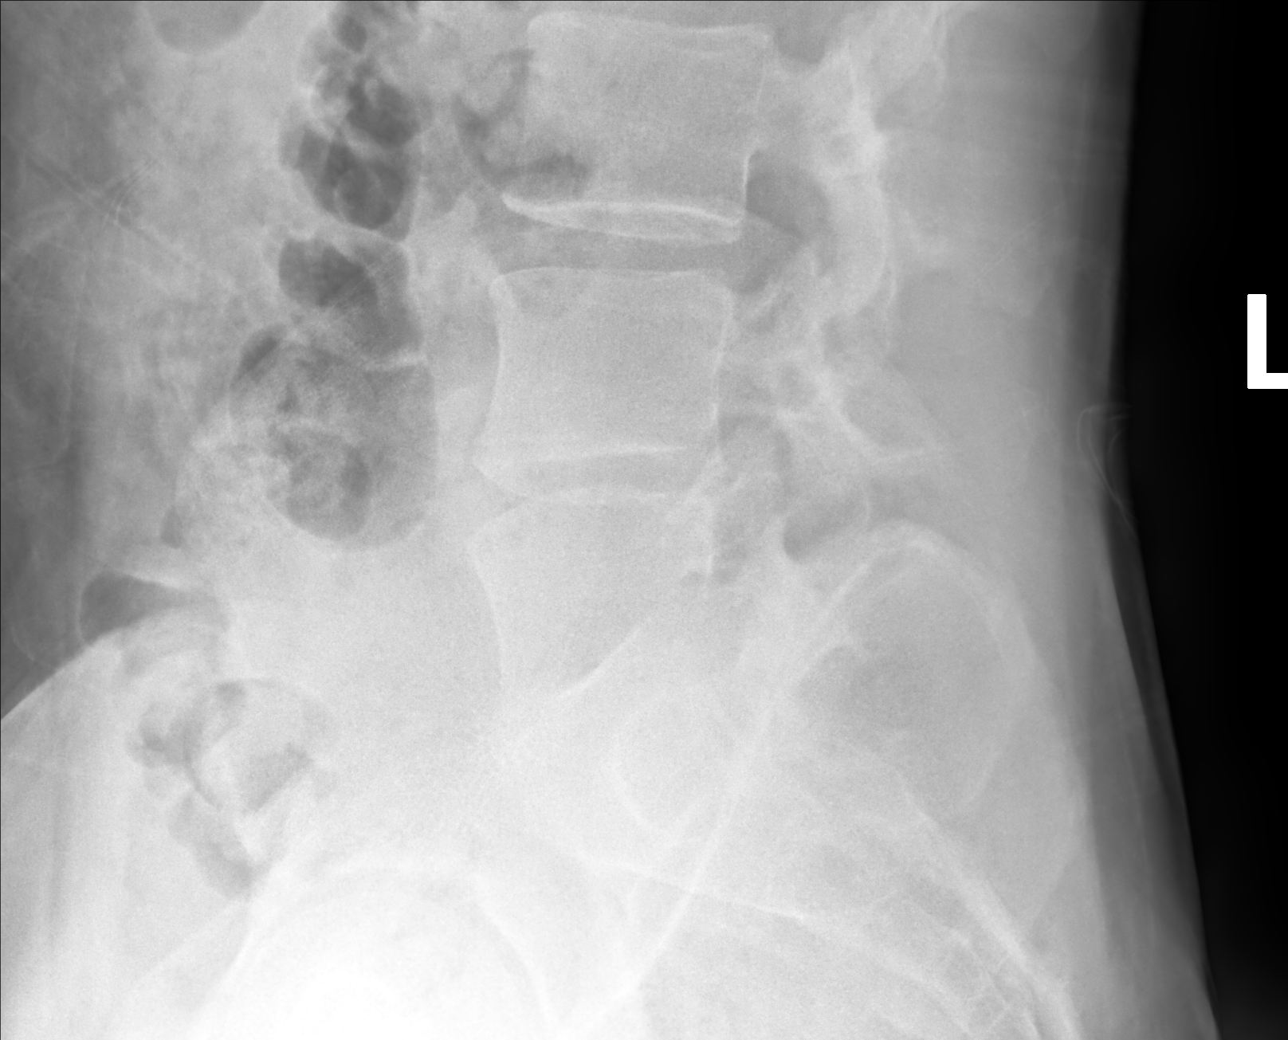

[5 of 5 positions shown; findings below may reference images not displayed]

FINDINGS: There is no evidence of lumbar spine fracture. Alignment is normal.
Intervertebral disc spaces are maintained.
IMPRESSION: Negative.

## 2021-11-24 ENCOUNTER — Ambulatory Visit (INDEPENDENT_AMBULATORY_CARE_PROVIDER_SITE_OTHER): Payer: 59 | Admitting: Family

## 2021-11-24 ENCOUNTER — Encounter (INDEPENDENT_AMBULATORY_CARE_PROVIDER_SITE_OTHER): Payer: Self-pay | Admitting: Family

## 2021-11-24 VITALS — BP 124/82 | HR 67 | Temp 97.7°F | Resp 13 | Ht 68.5 in | Wt 185.2 lb

## 2021-11-24 DIAGNOSIS — M940 Chondrocostal junction syndrome [Tietze]: Secondary | ICD-10-CM

## 2021-11-24 NOTE — Patient Instructions (Signed)
Discussed with patient appears to be musculoskeletal in nature.  Should be drinking 85 ounces of water a day.  Over-the-counter Motrin/Tylenol as needed.  Stressed following up/establishing with primary care.    Recommended staying hydrated with Gatorade, Powerade Pedialyte type fluids.  Over-the-counter Motrin/Tylenol as directed.   Elevate head of bed, humidifier next to bed, saline nasal spray, turn off any fans in bedroom.

## 2021-11-24 NOTE — Progress Notes (Signed)
Patient ID:     Spencer Sanchez is a pleasant 36 y.o. male who presents for Pain in chest bones and cough for past month     Brief chart review completed.  ER note reviewed from September 26, 2021.  Does a sitting job mainly.   Denies injury or trauma.      Patient thinks may be postural.  Denies increase shortness of breath, abdominal pain.  Has been taking the medication from last visit help with inflammation.    Status was used for entire visit.    The following portions of the patient's history were reviewed and updated as appropriate: allergies, current medications, past medical history, past surgical history and problem list.    This provider utilized all appropriate PPE equipment during this visit "loop mask" "Face shield/eye glasses" Gown/gloves" "patient wearing face mask/covering" "Family is wearing mask if present in room with pt",  All provider tools were cleaned with disinfectant solution before and after use on this patient.        Review of Systems   Respiratory:  Positive for chest tightness. Negative for cough, shortness of breath and wheezing.    Cardiovascular:  Negative for chest pain and palpitations.   Gastrointestinal:  Negative for abdominal pain.        Objective:     Vitals:    11/24/21 1916   BP: 124/82   Pulse: 67   Resp: 13   Temp: 97.7 F (36.5 C)   TempSrc: Tympanic   Weight: 84 kg (185 lb 3 oz)   Height: 1.74 m (5' 8.5")      Physical Exam  Vitals and nursing note reviewed.   Constitutional:       Appearance: Normal appearance. He is normal weight. He is not ill-appearing or toxic-appearing.   HENT:      Mouth/Throat:      Mouth: Mucous membranes are moist.      Pharynx: Oropharynx is clear. No oropharyngeal exudate or posterior oropharyngeal erythema.   Cardiovascular:      Rate and Rhythm: Normal rate and regular rhythm.      Heart sounds: Normal heart sounds.   Pulmonary:      Effort: Pulmonary effort is normal. No respiratory distress.      Breath sounds: Normal breath sounds. No  wheezing.      Comments: Tenderness midsternal palpation.  Abdomen soft, bowel sounds x4.  I can reproduce patient's pain with palpation.  Chest:      Chest wall: Tenderness present.   Abdominal:      General: Abdomen is flat. Bowel sounds are normal. There is no distension.      Palpations: Abdomen is soft.      Tenderness: There is no abdominal tenderness.   Musculoskeletal:      Cervical back: Normal range of motion and neck supple. No tenderness.   Lymphadenopathy:      Cervical: No cervical adenopathy.   Skin:     General: Skin is warm and dry.      Capillary Refill: Capillary refill takes less than 2 seconds.      Findings: No rash.   Neurological:      General: No focal deficit present.      Mental Status: He is alert.   Psychiatric:         Mood and Affect: Mood normal.         Behavior: Behavior normal.         Thought Content: Thought content normal.  Judgment: Judgment normal.              Assessment and Plan:   No orders of the defined types were placed in this encounter.       1. Costochondritis             Assessment & Plan      Discussed with patient appears to be musculoskeletal in nature.  Should be drinking 85 ounces of water a day.  Over-the-counter Motrin/Tylenol as needed.  Stressed following up/establishing with primary care.    Recommended staying hydrated with Gatorade, Powerade Pedialyte type fluids.  Over-the-counter Motrin/Tylenol as directed.   Elevate head of bed, humidifier next to bed, saline nasal spray, turn off any fans in bedroom.    Time in exam room with patient with interpreter and his parents was 45 minutes.    The patient was counseled on possible medication/treatment side effects. The patient was encouraged to take any and all medications as prescribed, read all pharmacy handouts, and patient instructions.     Vital signs noted no acute management indicated at this time. The patient was instructed to follow up with their primary care provider. The patient was also  instructed to seek medical advice if they experience worsening symptoms and to follow up in 2-3 days if no improvement in symptoms.     The patient was instructed to keep all current healthcare appointments. At the conclusion of the visit we reviewed diagnosis, treatment plan, diagnostic tests, prescriptions and follow up instructions pertaining to this visit. All patient questions and concerns regarding the current condition were addressed.     Strict ER precautions.     Note:  This chart was generated by an EMR and may contain errors, including typographical, or omissions not intended by the user. This chart was generated by the Epic EMR system/speech recognition and may contain inherent errors or omissions not intended by the user. Grammatical errors, random word insertions, deletions, pronoun errors and incomplete sentences are occasional consequences of this technology due to software limitations. Not all errors are caught or corrected. If there are questions or concerns about the content of this note or information contained within the body of this dictation they should be addressed directly with the author for clarification

## 2021-12-09 ENCOUNTER — Emergency Department
Admission: EM | Admit: 2021-12-09 | Discharge: 2021-12-09 | Disposition: A | Discharge status: Home / Self Care | Payer: 59 | Attending: Emergency Medicine | Admitting: Emergency Medicine

## 2021-12-09 DIAGNOSIS — N3281 Overactive bladder: Secondary | ICD-10-CM | POA: Insufficient documentation

## 2021-12-09 DIAGNOSIS — R350 Frequency of micturition: Secondary | ICD-10-CM | POA: Insufficient documentation

## 2021-12-09 LAB — VH URINALYSIS WITH MICROSCOPIC AND CULTURE IF INDICATED
Bilirubin, UA: NEGATIVE
Blood, UA: NEGATIVE
Glucose, UA: NEGATIVE mg/dL
Ketones UA: NEGATIVE mg/dL
Leukocyte Esterase, UA: NEGATIVE Leu/uL
Nitrite, UA: NEGATIVE
Protein, UR: NEGATIVE mg/dL
Urine Specific Gravity: 1.009 (ref 1.001–1.040)
Urobilinogen, UA: NORMAL mg/dL
pH, Urine: 7 pH (ref 5.0–8.0)

## 2021-12-09 LAB — VH DEXTROSE STICK GLUCOSE: Glucose POCT: 92 mg/dL (ref 71–99)

## 2021-12-09 MED ORDER — PHENAZOPYRIDINE HCL 200 MG PO TABS
200.0000 mg | ORAL_TABLET | Freq: Three times a day (TID) | ORAL | 0 refills | Status: AC
Start: 2021-12-09 — End: 2021-12-11

## 2021-12-09 NOTE — ED Provider Notes (Signed)
[facility]  EMERGENCY DEPARTMENT  History and Physical Exam     Patient Name: Spencer Sanchez, Spencer Sanchez  Encounter Date:  12/09/2021  Attending Physician: Retta Diones, MD  Room:  N1/N1-A  Patient DOB:  Mar 23, 1985  Age: 36 y.o. male  MRN:  [id number]  PCP: Pcp, None, MD      Diagnosis/Disposition:  MDM:     Final Impression  1. Overactive bladder        Disposition  ED Disposition       ED Disposition   Discharge    Condition   --    Date/Time   Sat Dec 09, 2021  2:48 PM    Comment   Kenai Fluegel discharge to home/self care.    Condition at disposition: Stable                 Follow up  Ferne Reus, MD  [address]  [address] [address]  [phone]    Schedule an appointment as soon as possible for a visit         PRESCRIPTION MANAGEMENT    New Meds:   Discharge Medication List as of 12/09/2021  3:32 PM        START taking these medications    Details   phenazopyridine (PYRIDIUM) 200 MG tablet Take 1 tablet (200 mg) by mouth 3 (three) times daily with meals for 2 days, Starting Sat 12/09/2021, Until Mon 12/11/2021, E-Rx              Medications Changed:   Discharge Medication List as of 12/09/2021  3:32 PM          Discontinued Meds:   Discharge Medication List as of 12/09/2021  3:32 PM            Chief complaint: Urinary Frequency      Differential Diagnosis/Discussion:  Interstitial cystitis, overactive bladder syndrome    All tests were ordered by me and are noted below. The results of all tests that were ordered by me were independently reviewed and interpreted by myself. The significant findings are discussed in ED course below. If an EKG was obtained, my independent interpretation is noted below  Urinalysis negative  Glucose 92     We will refer to follow-up with urology.  Will attempt short trial of Pyridium to see if his symptoms improve            History of Presenting Illness:     Nursing Triage note: Patient presents with c/o excessive urination at night for about 2 weeks. States hes been getting  up 8-10x per night to urinate. Denies burning, hematuria. States it is usually small amounts.    Spencer Sanchez is a 35 y.o. male presenting with increasing urinary frequency over the last 2 weeks.  Patient states his entire life he has had increased urinary frequency.  States he typically wakes up at night to urinate up to 4 times per night and he states this has been the case since he was a young child.  He has never sought medical attention for this.  For the last 2 weeks however he has had increase in frequency having to get up 8-10 times per night.  Denies dysuria urgency.  Denies fevers.  Denies abdominal pain.  Denies back pain.  He states he has never sought medical attention for this however his mother is currently being evaluated in the emergency department for another complaint and so he thought he should check in.  Denies chest pain cough shortness of breath.      Physical Exam  MDM Complexity     Blood pressure 130/78, pulse 71, temperature 97.9 F (36.6 C), temperature source Temporal, resp. rate 16, height 1.74 m, weight 85.4 kg, SpO2 98 %.     Physical Exam  Vitals and nursing note reviewed.   Constitutional:       General: He is not in acute distress.  HENT:      Head: Normocephalic and atraumatic.      Mouth/Throat:      Mouth: Mucous membranes are moist.   Eyes:      Extraocular Movements: Extraocular movements intact.   Cardiovascular:      Rate and Rhythm: Normal rate and regular rhythm.      Heart sounds: Normal heart sounds.   Pulmonary:      Effort: Pulmonary effort is normal.      Breath sounds: Normal breath sounds. No wheezing.   Abdominal:      Palpations: Abdomen is soft.      Tenderness: There is no abdominal tenderness. There is no guarding or rebound.   Musculoskeletal:         General: Normal range of motion.      Cervical back: Normal range of motion and neck supple.   Skin:     General: Skin is warm and dry.   Neurological:      Mental Status: He is alert and oriented to person,  place, and time.      GCS: GCS eye subscore is 4. GCS verbal subscore is 5. GCS motor subscore is 6.   Psychiatric:         Mood and Affect: Mood and affect normal.          MDM Complexity:    History obtained from:Patient  HPI/ROS is limited by: none    Past Medical Records reviewed: Care Everywhere Notes    Medical Conditions that Impact her Care Today:  Sleep apnea  UTI,  Prescription Management: See Diagnosis/Disposition area for Prescription Management details    Social Determinants of Health that Impact Care: None that negatively impact care    Available past medical, family, social, and surgical histories have been reviewed by myself. The clinical impression and plan have been discussed with the patient and/or the patient's family. All questions have been answered.     Diagnostic Results:     LAB STUDIES    All lab value have been personally reviewed by me    Results       Procedure Component Value Units Date/Time    Dextrose Stick Glucose [[id number]] Collected: 12/09/21 1400    Specimen: Blood Updated: 12/09/21 1401     Glucose POCT 92 mg/dL     Urinalysis w Microscopic and Culture if Indicated [[id number]] Collected: 12/09/21 1327    Specimen: Urine, Random Updated: 12/09/21 1343     Color, UA Straw     Clarity, UA Clear     Urine Specific Gravity 1.009     pH, Urine 7.0 pH      Protein, UR Negative mg/dL      Glucose, UA Negative mg/dL      Ketones UA Negative mg/dL      Bilirubin, UA Negative     Blood, UA Negative     Nitrite, UA Negative     Urobilinogen, UA Normal mg/dL      Leukocyte Esterase, UA Negative Leu/uL  RADIOLOGIC STUDIES    All images have been personally viewed by me    No results found.      EKG:     EKG:   Last EKG Result       None             PROCEDURES          ORDERS PLACED THIS VISIT     Orders  Orders Placed This Encounter   Procedures    Urinalysis w Microscopic and Culture if Indicated    Dextrose Stick Glucose    NSG Communication: Glucose POCT order               Allergies & Medications:     Pt has No Known Allergies.    Discharge Medication List as of 12/09/2021  3:32 PM              ATTESTATIONS     Retta Diones ,MD    The results of diagnostic studies have been reviewed by myself. The above past medical, family, social, and surgical histories have been reviewed by myself. The clinical impression and plan have been discussed with the patient and/or the patient's family. All questions have been answered.    Note:  This chart was generated by an EMR and may contain errors, including typographical, or omissions not intended by the user. This chart was generated by the Epic EMR system/speech recognition and may contain inherent errors or omissions not intended by the user. Grammatical errors, random word insertions, deletions, pronoun errors and incomplete sentences are occasional consequences of this technology due to software limitations. Not all errors are caught or corrected. If there are questions or concerns about the content of this note or information contained within the body of this dictation they should be addressed directly with the author for clarification            Ermalene Postin, MD  12/09/21 1729

## 2021-12-19 ENCOUNTER — Emergency Department: Payer: 59

## 2021-12-19 ENCOUNTER — Emergency Department
Admission: EM | Admit: 2021-12-19 | Discharge: 2021-12-19 | Disposition: A | Discharge status: Home / Self Care | Payer: 59 | Attending: Physician Assistant | Admitting: Physician Assistant

## 2021-12-19 DIAGNOSIS — R1310 Dysphagia, unspecified: Secondary | ICD-10-CM | POA: Insufficient documentation

## 2021-12-19 DIAGNOSIS — R1013 Epigastric pain: Secondary | ICD-10-CM

## 2021-12-19 DIAGNOSIS — R0789 Other chest pain: Secondary | ICD-10-CM | POA: Insufficient documentation

## 2021-12-19 LAB — CBC AND DIFFERENTIAL
Basophils %: 0.5 % (ref 0.0–3.0)
Basophils Absolute: 0 10*3/uL (ref 0.0–0.3)
Eosinophils %: 2.3 % (ref 0.0–7.0)
Eosinophils Absolute: 0.2 10*3/uL (ref 0.0–0.8)
Hematocrit: 42.1 % (ref 39.0–52.5)
Hemoglobin: 14.2 gm/dL (ref 13.0–17.5)
Lymphocytes Absolute: 1.8 10*3/uL (ref 0.6–5.1)
Lymphocytes: 22.7 % (ref 15.0–46.0)
MCH: 29 pg (ref 28–35)
MCHC: 34 gm/dL (ref 32–36)
MCV: 87 fL (ref 80–100)
MPV: 11 fL — ABNORMAL HIGH (ref 6.0–10.0)
Monocytes Absolute: 0.6 10*3/uL (ref 0.1–1.7)
Monocytes: 7.6 % (ref 3.0–15.0)
Neutrophils %: 66.9 % (ref 42.0–78.0)
Neutrophils Absolute: 5.3 10*3/uL (ref 1.7–8.6)
PLT CT: 177 10*3/uL (ref 130–440)
RBC: 4.86 10*6/uL (ref 4.00–5.70)
RDW: 12.8 % (ref 11.0–14.0)
WBC: 8 10*3/uL (ref 4.0–11.0)

## 2021-12-19 LAB — COMPREHENSIVE METABOLIC PANEL
ALT: 28 U/L (ref 0–55)
AST (SGOT): 27 U/L (ref 10–42)
Albumin/Globulin Ratio: 1.42 Ratio (ref 0.80–2.00)
Albumin: 4.7 gm/dL (ref 3.5–5.0)
Alkaline Phosphatase: 79 U/L (ref 40–145)
Anion Gap: 12.6 mMol/L (ref 7.0–18.0)
BUN / Creatinine Ratio: 11.6 Ratio (ref 10.0–30.0)
BUN: 10 mg/dL (ref 7–22)
Bilirubin, Total: 0.7 mg/dL (ref 0.1–1.2)
CO2: 28 mMol/L (ref 20–30)
Calcium: 9.9 mg/dL (ref 8.5–10.5)
Chloride: 104 mMol/L (ref 98–110)
Creatinine: 0.86 mg/dL (ref 0.80–1.30)
EGFR: 116 mL/min/{1.73_m2} (ref 60–150)
Globulin: 3.3 gm/dL (ref 2.0–4.0)
Glucose: 92 mg/dL (ref 71–99)
Osmolality Calculated: 280 mOsm/kg (ref 275–300)
Potassium: 3.6 mMol/L (ref 3.5–5.3)
Protein, Total: 8 gm/dL (ref 6.0–8.3)
Sodium: 141 mMol/L (ref 136–147)

## 2021-12-19 LAB — LIPASE: Lipase: 65 U/L (ref 8–78)

## 2021-12-19 LAB — VH EXTRA SPECIMEN LABEL

## 2021-12-19 LAB — TROPONIN I: Troponin I: 0.01 ng/mL (ref 0.00–0.02)

## 2021-12-19 MED ORDER — SUCRALFATE 1 G PO TABS
1.0000 g | ORAL_TABLET | Freq: Four times a day (QID) | ORAL | 0 refills | Status: AC
Start: 2021-12-19 — End: ?

## 2021-12-19 MED ORDER — ALUM & MAG HYDROXIDE-SIMETH 200-200-20 MG/5ML PO SUSP
30.0000 mL | ORAL | Status: DC | PRN
Start: 2021-12-19 — End: 2021-12-20
  Administered 2021-12-19: 30 mL via ORAL

## 2021-12-19 MED ORDER — DICYCLOMINE HCL 20 MG PO TABS
ORAL_TABLET | ORAL | Status: AC
Start: 2021-12-19 — End: ?
  Filled 2021-12-19: qty 1

## 2021-12-19 MED ORDER — DICYCLOMINE HCL 20 MG PO TABS
20.0000 mg | ORAL_TABLET | Freq: Once | ORAL | Status: AC
Start: 2021-12-19 — End: 2021-12-19
  Administered 2021-12-19: 20 mg via ORAL

## 2021-12-19 MED ORDER — SUCRALFATE 1 GM/10ML PO SUSP
1.0000 g | Freq: Once | ORAL | Status: AC
Start: 2021-12-19 — End: 2021-12-19
  Administered 2021-12-19: 1 g via ORAL

## 2021-12-19 MED ORDER — FAMOTIDINE 20 MG PO TABS
20.0000 mg | ORAL_TABLET | Freq: Two times a day (BID) | ORAL | 0 refills | Status: AC
Start: 2021-12-19 — End: 2022-01-02

## 2021-12-19 MED ORDER — SUCRALFATE 1 GM/10ML PO SUSP
ORAL | Status: AC
Start: 2021-12-19 — End: ?
  Filled 2021-12-19: qty 10

## 2021-12-19 MED ORDER — ALUM & MAG HYDROXIDE-SIMETH 200-200-20 MG/5ML PO SUSP
ORAL | Status: AC
Start: 2021-12-19 — End: ?
  Filled 2021-12-19: qty 30

## 2021-12-20 NOTE — ED Provider Notes (Signed)
[facility]  EMERGENCY DEPARTMENT  History and Physical Exam       Patient Name: Spencer Sanchez, Spencer Sanchez  Encounter Date:  12/19/2021  Physician Assistant: Shanda Howells, PA-C  Attending Physician: Dr. Hassell Done  PCP: Marisa Sprinkles, MD  Patient DOB:  10-15-1985  MRN:  [id number]  Room:  E58/E58-A    History of Presenting Illness     Chief complaint: Abdominal Pain    HPI/ROS given by: Patient    Spencer Sanchez is a 36 y.o. male who presents with complaint of epigastric abdominal pain that radiates into chest and neck progressive over the past 4 days. Patient reports he is unable to find position of comfort in bed due to pain. Reports pain with both eating and drinking, even water. He is a rather limited historian and has trouble describing pain, but reports "it's like a knife on your skin, but deep." Rated 8/10 in severity.     Denies any fevers, chills. No difficulty breathing. No nausea, vomiting, diarrhea. Denies any known chronic medical conditions.      Review of Systems     Review of Systems   Constitutional:  Negative for chills and fever.   HENT:  Negative for congestion, ear pain and sore throat.    Eyes:  Negative for pain and visual disturbance.   Respiratory:  Negative for cough and shortness of breath.    Cardiovascular:  Negative for chest pain.   Gastrointestinal:  Positive for abdominal pain. Negative for diarrhea, nausea and vomiting.   Genitourinary:  Negative for difficulty urinating.   Musculoskeletal:  Negative for back pain.   Skin:  Negative for rash.   Neurological:  Negative for dizziness, weakness and headaches.   Hematological:  Does not bruise/bleed easily.   Psychiatric/Behavioral: Negative.     All other systems reviewed and are negative.       Allergies & Medications     Pt has No Known Allergies.    Discharge Medication List as of 12/19/2021 11:36 PM           Past Medical History     Pt has a past medical history of Asthma and Sleep apnea.     Past Surgical History     Pt  has no  past surgical history.     Family History     The family history includes Heart disease in his father and mother.     Social History     Pt reports that he has never smoked. He has never used smokeless tobacco. He reports that he does not drink alcohol and does not use drugs.     Physical Exam     Blood pressure 106/80, pulse 64, temperature (!) 96.8 F (36 C), temperature source Temporal, resp. rate 18, weight 85.5 kg, SpO2 97 %.    Physical Exam  Constitutional:       General: He is not in acute distress.     Appearance: He is well-developed.   HENT:      Head: Normocephalic and atraumatic.      Right Ear: External ear normal.      Left Ear: External ear normal.   Eyes:      General:         Right eye: No discharge.         Left eye: No discharge.      Conjunctiva/sclera: Conjunctivae normal.      Pupils: Pupils are equal, round, and reactive to light.   Cardiovascular:  Rate and Rhythm: Normal rate and regular rhythm.      Heart sounds: No murmur heard.  Pulmonary:      Effort: Pulmonary effort is normal. No respiratory distress.      Breath sounds: Normal breath sounds.   Abdominal:      Palpations: Abdomen is soft.      Tenderness: There is no abdominal tenderness.   Musculoskeletal:         General: Normal range of motion.      Cervical back: Normal range of motion.   Skin:     General: Skin is warm and dry.      Findings: No rash.   Neurological:      Mental Status: He is alert and oriented to person, place, and time.   Psychiatric:         Behavior: Behavior normal.          Diagnostic Results     The results of the diagnostic studies below have been reviewed by myself:    Labs  Results       Procedure Component Value Units Date/Time    Comprehensive metabolic panel [[id number]] Collected: 12/19/21 2019    Specimen: Plasma Updated: 12/19/21 2049     Sodium 141 mMol/L      Potassium 3.6 mMol/L      Chloride 104 mMol/L      CO2 28 mMol/L      Calcium 9.9 mg/dL      Glucose 92 mg/dL      Creatinine 4.09  mg/dL      BUN 10 mg/dL      Protein, Total 8.0 gm/dL      Albumin 4.7 gm/dL      Alkaline Phosphatase 79 U/L      ALT 28 U/L      AST (SGOT) 27 U/L      Bilirubin, Total 0.7 mg/dL      Albumin/Globulin Ratio 1.42 Ratio      Anion Gap 12.6 mMol/L      BUN / Creatinine Ratio 11.6 Ratio      EGFR 116 mL/min/1.35m2      Osmolality Calculated 280 mOsm/kg      Globulin 3.3 gm/dL     Narrative:      This order is a replacement of the rejected order with accession number [id number].    Troponin I [[id number]] Collected: 12/19/21 1828    Specimen: Plasma Updated: 12/19/21 2035     Troponin I 0.01 ng/mL     Lipase [[id number]] Collected: 12/19/21 1828    Specimen: Plasma Updated: 12/19/21 1911     Lipase 65 U/L     CBC and differential [[id number]]  (Abnormal) Collected: 12/19/21 1828    Specimen: Blood Updated: 12/19/21 1852     WBC 8.0 K/cmm      RBC 4.86 M/cmm      Hemoglobin 14.2 gm/dL      Hematocrit 02.7 %      MCV 87 fL      MCH 29 pg      MCHC 34 gm/dL      RDW 25.3 %      PLT CT 177 K/cmm      MPV 11.0 fL      Neutrophils % 66.9 %      Lymphocytes 22.7 %      Monocytes 7.6 %      Eosinophils % 2.3 %      Basophils % 0.5 %  Neutrophils Absolute 5.3 K/cmm      Lymphocytes Absolute 1.8 K/cmm      Monocytes Absolute 0.6 K/cmm      Eosinophils Absolute 0.2 K/cmm      Basophils Absolute 0.0 K/cmm     Collect Blood [[id number]] Collected: 12/19/21 1828    Specimen: Other Updated: 12/19/21 1835     Collect Blood Label Notification            Radiologic Studies  CT Chest WO Contrast    Result Date: 12/19/2021  Normal CT of the Thorax without contrast. ReadingStation:[email or web address]    [place] Abd RUQ    Result Date: 12/19/2021  No cholelithiasis or evidence of acute cholecystitis. No biliary duct dilatation. Hepatomegaly. ReadingStation:[email or web address]    XR Chest 2 Views    Result Date: 12/19/2021  No acute findings. ReadingStation:[email or web address]     EKG: I HAVE INTERPRETED THE EKG AT THE TIME IT WAS PERFORMED AND MY OFFICIAL READING IS  AS FOLLOWS:     Last EKG Result       Procedure Component Value Units Date/Time    ECG 12 lead [[id number]] Collected: 12/19/21 1806     Updated: 12/19/21 1809     Patient Age 19 years      Patient DOB 1985/11/25     Patient Height --     Patient Weight --     Interpretation Text Sinus rhythm     Physician Interpreter --     Ventricular Rate 70 //min      QRS Duration 100 ms      P-R Interval 179 ms      Q-T Interval 377 ms      Q-T Interval(Corrected) 407 ms      P Wave Axis 67 deg      QRS Axis 41 deg      T Axis 40 years              ED Meds     ED Medication Orders (From admission, onward)      Start Ordered     Status Ordering Provider    12/19/21 2207 12/19/21 2206  sucralfate (CARAFATE) 1 GM/10ML oral suspension 1 g  Once in ED        Route: Oral  Ordered Dose: 1 g       Last MAR action: Given Shanda Howells E    12/19/21 1953 12/19/21 1952  dicyclomine (BENTYL) tablet 20 mg  Once in ED        Route: Oral  Ordered Dose: 20 mg       Last MAR action: Given Ruffin Pyo    12/19/21 1948 12/19/21 1948    Every 4 hours PRN        Route: Oral  Ordered Dose: 30 mL       Discontinued Shanda Howells E             ED Course and Medical Decision Making     Old medical records and nursing/triage notes were reviewed by myself.    MDM   Amount and complexity of data contributing to medical decision making for this encounter:    Review of historical data: Notes: prior ED/UCC visits    I have independently interpreted the following: X-ray: No infiltrate or pneumothorax noted  CT: No pneumothorax or pleural effusion noted and no pneumomediastinum.   EKG: Independent interpretation: normal sinus rhythm, no ischemic changes or repolarization abnormalities  Ultrasound -cholesystitis  LABS: no leukocytosis, Hgb/Hct stable, electrolytes unremarkable  without AKI or transminitis, -lipase          Escalation of Care considered, and ultimately not performed: At this time, the patient is most appropriate for outpatient  management.  Barriers to Care at this time, including, but not limited to: Patient does not have established PCP.  Protocols and prescription drugs considered including but not limited to: Pain Medication and HEART Score          DIAGNOSTIC CONSIDERATIONS    The differential diagnosis includes, but is not limited to vomiting, ureterolithiasis/renal colic, UTI/pyelonephritis, gastroenteritis/gastritis, peptic ulcer disease, GERD, appendicitis, aortic aneurysm, MI/angina/mesenteric ischemia, bowel perforation/obstruction, pancreatitis, cholecystitis, biliary colic, diverticulitis    Heart Score:  Used for ACS Risk Stratification    Low Score (0-3 points) = Risk of MACE of 0.9-1.7%  Moderate Score (4-6 point) = Risk of MACE of 12-16.66%  High Score (7-10 points) = Risk of MACE of 50-65%    History: Slightly suspicious (0)  ECG: Normal (0)  Age: < 45 (0)  Risk: No known risk factors (0)  Troponin: <= normal limit (0)    Score: 0     CONSULTS  None    ED COURSE & MDM    ED Course as of 12/20/21 0246   Tue Dec 19, 2021   2203 Patient reassessed at this time. Continues to have pain, especially with swallowing. Will obtain CT chest, add carafate [RS]      ED Course User Index  [RS] Ruffin Pyo, PA     11:34PM Patient afebrile with stable vital signs. Pain seemingly somewhat improved at this time. Requesting discharge home. We discussed laboratory, EKG, radiographic findings. Suspect dyspepsia. Dietary modifications provided. Patient voiced understanding. Comfortable with disposition home.    In addition to the above history, please see nursing notes. Allergies, meds, past medical, family, social hx, and the results of the diagnostic studies performed have been reviewed by myself.    This chart was generated by an EMR and may contain errors or omissions not intended by the user.     Procedures / Critical Care     None     Diagnosis / Disposition     Clinical Impression  1. Dyspepsia    2. Atypical chest pain    3.  Epigastric pain    4. Painful swallowing        Disposition  ED Disposition       ED Disposition   Discharge    Condition   --    Date/Time   Tue Dec 19, 2021 11:35 PM    Comment   Rodolph Bong discharge to home/self care.    Condition at disposition: Stable                 Follow up for Discharged Patients  [facility]  [address]  [place] [address]  [phone]          Prescriptions for Discharged Patients  Discharge Medication List as of 12/19/2021 11:36 PM        START taking these medications    Details   famotidine (PEPCID) 20 MG tablet Take 1 tablet (20 mg) by mouth every 12 (twelve) hours for 14 days, Starting Tue 12/19/2021, Until Tue 01/02/2022, E-Rx      sucralfate (CARAFATE) 1 g tablet Take 1 tablet (1 g) by mouth 4 (four) times daily, Starting Tue 12/19/2021, E-Rx  Ruffin Pyo, [place]  12/21/21 1133

## 2021-12-22 LAB — ECG 12-LEAD
P Wave Axis: 67 deg
P-R Interval: 179 ms
Patient Age: 36 years
Q-T Interval(Corrected): 407 ms
Q-T Interval: 377 ms
QRS Axis: 41 deg
QRS Duration: 100 ms
T Axis: 40 years
Ventricular Rate: 70 //min

## 2022-01-15 ENCOUNTER — Ambulatory Visit (INDEPENDENT_AMBULATORY_CARE_PROVIDER_SITE_OTHER): Payer: 59 | Admitting: Family Medicine

## 2023-05-12 ENCOUNTER — Emergency Department (HOSPITAL_BASED_OUTPATIENT_CLINIC_OR_DEPARTMENT_OTHER)
Admission: EM | Admit: 2023-05-12 | Discharge: 2023-05-12 | Disposition: A | Discharge status: Home / Self Care | Attending: Emergency Medicine | Admitting: Emergency Medicine

## 2023-05-12 ENCOUNTER — Emergency Department (HOSPITAL_BASED_OUTPATIENT_CLINIC_OR_DEPARTMENT_OTHER)

## 2023-05-12 ENCOUNTER — Other Ambulatory Visit: Payer: Self-pay

## 2023-05-12 ENCOUNTER — Encounter (HOSPITAL_BASED_OUTPATIENT_CLINIC_OR_DEPARTMENT_OTHER): Payer: Self-pay

## 2023-05-12 DIAGNOSIS — R1031 Right lower quadrant pain: Secondary | ICD-10-CM | POA: Insufficient documentation

## 2023-05-12 DIAGNOSIS — R197 Diarrhea, unspecified: Secondary | ICD-10-CM | POA: Insufficient documentation

## 2023-05-12 DIAGNOSIS — R112 Nausea with vomiting, unspecified: Secondary | ICD-10-CM | POA: Diagnosis not present

## 2023-05-12 LAB — CBC
HCT: 41.1 % (ref 39.0–52.0)
Hemoglobin: 14.3 g/dL (ref 13.0–17.0)
MCH: 29.4 pg (ref 26.0–34.0)
MCHC: 34.8 g/dL (ref 30.0–36.0)
MCV: 84.6 fL (ref 80.0–100.0)
Platelets: 192 10*3/uL (ref 150–400)
RBC: 4.86 MIL/uL (ref 4.22–5.81)
RDW: 13.5 % (ref 11.5–15.5)
WBC: 3.9 10*3/uL — ABNORMAL LOW (ref 4.0–10.5)
nRBC: 0 % (ref 0.0–0.2)

## 2023-05-12 LAB — COMPREHENSIVE METABOLIC PANEL
ALT: 45 U/L — ABNORMAL HIGH (ref 0–44)
AST: 32 U/L (ref 15–41)
Albumin: 4.3 g/dL (ref 3.5–5.0)
Alkaline Phosphatase: 52 U/L (ref 38–126)
Anion gap: 9 (ref 5–15)
BUN: 14 mg/dL (ref 6–20)
CO2: 23 mmol/L (ref 22–32)
Calcium: 8.9 mg/dL (ref 8.9–10.3)
Chloride: 102 mmol/L (ref 98–111)
Creatinine, Ser: 0.84 mg/dL (ref 0.61–1.24)
GFR, Estimated: >60 mL/min (ref >60)
Glucose, Bld: 97 mg/dL (ref 70–99)
Potassium: 3.3 mmol/L — ABNORMAL LOW (ref 3.5–5.1)
Sodium: 134 mmol/L — ABNORMAL LOW (ref 135–145)
Total Bilirubin: 1 mg/dL (ref 0.0–1.2)
Total Protein: 7.5 g/dL (ref 6.5–8.1)

## 2023-05-12 LAB — URINALYSIS, ROUTINE W REFLEX MICROSCOPIC
Bilirubin Urine: NEGATIVE
Glucose, UA: NEGATIVE mg/dL
Hgb urine dipstick: NEGATIVE
Ketones, ur: NEGATIVE mg/dL
Leukocytes,Ua: NEGATIVE
Nitrite: NEGATIVE
Protein, ur: NEGATIVE mg/dL
Specific Gravity, Urine: 1.02 (ref 1.005–1.030)
pH: 6 (ref 5.0–8.0)

## 2023-05-12 LAB — RESP PANEL BY RT-PCR (RSV, FLU A&B, COVID)  RVPGX2
Influenza A by PCR: NEGATIVE
Influenza B by PCR: NEGATIVE
Resp Syncytial Virus by PCR: NEGATIVE
SARS Coronavirus 2 by RT PCR: NEGATIVE

## 2023-05-12 LAB — LIPASE, BLOOD: Lipase: 44 U/L (ref 11–51)

## 2023-05-12 MED ORDER — KETOROLAC TROMETHAMINE 15 MG/ML IJ SOLN
15.0000 mg | Freq: Once | INTRAMUSCULAR | Status: AC
  Administered 2023-05-12: 15 mg via INTRAVENOUS
  Filled 2023-05-12: qty 1

## 2023-05-12 MED ORDER — ONDANSETRON HCL 4 MG PO TABS: 4.0000 mg | ORAL_TABLET | Freq: Three times a day (TID) | ORAL | Status: AC | PRN

## 2023-05-12 MED ORDER — ONDANSETRON 4 MG PO TBDP
4.0000 mg | ORAL_TABLET | Freq: Once | ORAL | Status: DC | PRN
Start: 2023-05-12 — End: 2023-05-12

## 2023-05-12 MED ORDER — IOHEXOL 300 MG/ML  SOLN
80.0000 mL | Freq: Once | INTRAMUSCULAR | Status: AC | PRN
  Administered 2023-05-12: 80 mL via INTRAVENOUS

## 2023-05-12 NOTE — ED Provider Notes (Signed)
 [facility] Provider Note   CSN: [id number] Arrival date & time: 05/12/23  1344     History  Chief Complaint  Patient presents with   Vomiting    Patrick Parsons is a 38 y.o. male.  HPI Patient is a 38 year old male presents the ED today with a 1 day history of right lower quadrant abdominal pain that has been accompanied with nausea, 1 episode of emesis, 2 episodes of diarrhea after ingesting dairy.  States that he has had generalized intolerance for dairy however more recently has become more intolerant and is now experiencing some generalized abdominal discomfort.  Denies fever, headache, cough, congestion, chest pain, shortness of breath, hematuria, dysuria, hematochezia, melena, hematemesis, lower extremity swelling    Home Medications Prior to Admission medications   Medication Sig Start Date End Date Taking? Authorizing Provider  ondansetron (ZOFRAN) 4 MG tablet Take 1 tablet (4 mg total) by mouth every 8 (eight) hours as needed for nausea or vomiting. 05/12/23  Yes Lunette Stands, PA-C  FLUoxetine (PROZAC) 20 MG capsule Take 1 capsule (20 mg total) by mouth daily. 12/12/20   Mliss Sax, MD  oxybutynin (DITROPAN XL) 5 MG 24 hr tablet Take 1 tablet (5 mg total) by mouth at bedtime. 02/10/21   Mliss Sax, MD  sulfamethoxazole-trimethoprim (BACTRIM DS) 800-160 MG tablet Take 1 tablet by mouth 2 (two) times daily. 01/14/21   [provider]      Allergies    Patient has no known allergies.    Review of Systems   Review of Systems  Gastrointestinal:  Positive for abdominal pain, diarrhea, nausea and vomiting.  All other systems reviewed and are negative.   Physical Exam Updated Vital Signs BP 109/74 (BP Location: Left Arm)   Pulse 70   Temp 98.1 F (36.7 C) (Oral)   Resp 16   Ht 5\' 8"  (1.727 m)   Wt 85.7 kg   SpO2 100%   BMI 28.74 kg/m  Physical Exam Vitals and nursing note reviewed.   Constitutional:      General: He is not in acute distress.    Appearance: Normal appearance. He is not ill-appearing or diaphoretic.  HENT:     Head: Normocephalic and atraumatic.     Mouth/Throat:     Mouth: Mucous membranes are moist.     Pharynx: Oropharynx is clear. No oropharyngeal exudate or posterior oropharyngeal erythema.  Eyes:     General: No scleral icterus.       Right eye: No discharge.        Left eye: No discharge.     Extraocular Movements: Extraocular movements intact.     Conjunctiva/sclera: Conjunctivae normal.  Cardiovascular:     Rate and Rhythm: Normal rate and regular rhythm.     Pulses: Normal pulses.     Heart sounds: Normal heart sounds. No murmur heard.    No friction rub. No gallop.  Pulmonary:     Effort: Pulmonary effort is normal. No respiratory distress.     Breath sounds: Normal breath sounds. No stridor. No wheezing or rales.  Abdominal:     General: Abdomen is flat.     Palpations: Abdomen is soft.     Tenderness: There is no abdominal tenderness. There is no right CVA tenderness, left CVA tenderness or guarding.  Musculoskeletal:        General: No tenderness.     Right lower leg: No edema.     Left  lower leg: No edema.  Skin:    General: Skin is warm and dry.     Coloration: Skin is not jaundiced or pale.     Findings: No bruising, erythema or rash.  Neurological:     General: No focal deficit present.     Mental Status: He is alert and oriented to person, place, and time. Mental status is at baseline.     Motor: No weakness.     Gait: Gait normal.  Psychiatric:        Mood and Affect: Mood normal.     ED Results / Procedures / Treatments   Labs (all labs ordered are listed, but only abnormal results are displayed) Labs Reviewed  COMPREHENSIVE METABOLIC PANEL - Abnormal; Notable for the following components:      Result Value   Sodium 134 (*)    Potassium 3.3 (*)    ALT 45 (*)    All other components within normal limits   CBC - Abnormal; Notable for the following components:   WBC 3.9 (*)    All other components within normal limits  RESP PANEL BY RT-PCR (RSV, FLU A&B, COVID)  RVPGX2  LIPASE, BLOOD  URINALYSIS, ROUTINE W REFLEX MICROSCOPIC    EKG None  Radiology CT ABDOMEN PELVIS W CONTRAST Result Date: 05/12/2023 CLINICAL DATA:  Right lower quadrant abdominal pain and bloating for a few days, nausea EXAM: CT ABDOMEN AND PELVIS WITH CONTRAST TECHNIQUE: Multidetector CT imaging of the abdomen and pelvis was performed using the standard protocol following bolus administration of intravenous contrast. RADIATION DOSE REDUCTION: This exam was performed according to the departmental dose-optimization program which includes automated exposure control, adjustment of the mA and/or kV according to patient size and/or use of iterative reconstruction technique. CONTRAST:  80mL OMNIPAQUE IOHEXOL 300 MG/ML  SOLN COMPARISON:  None Available. FINDINGS: Lower chest: No acute pleural or parenchymal lung disease. Hepatobiliary: No focal liver abnormality is seen. No gallstones, gallbladder wall thickening, or biliary dilatation. Pancreas: Unremarkable. No pancreatic ductal dilatation or surrounding inflammatory changes. Spleen: Normal in size without focal abnormality. Adrenals/Urinary Tract: Adrenal glands are unremarkable. Kidneys are normal, without renal calculi, focal lesion, or hydronephrosis. Bladder is unremarkable. Stomach/Bowel: No bowel obstruction or ileus. Normal appendix right lower quadrant. No bowel wall thickening or inflammatory change. Vascular/Lymphatic: No significant vascular findings are present. No enlarged abdominal or pelvic lymph nodes. Reproductive: Prostate is unremarkable. Other: No free fluid or free intraperitoneal gas. No abdominal wall hernia. Musculoskeletal: No acute or destructive bony abnormalities. Reconstructed images demonstrate no additional findings. IMPRESSION: 1. No acute intra-abdominal or  intrapelvic process. Normal appendix. Electronically Signed   By: Sharlet Salina M.D.   On: 05/12/2023 16:50    Procedures Procedures    Medications Ordered in ED Medications  ondansetron (ZOFRAN-ODT) disintegrating tablet 4 mg (has no administration in time range)  ketorolac (TORADOL) 15 MG/ML injection 15 mg (15 mg Intravenous Given 05/12/23 1619)  iohexol (OMNIPAQUE) 300 MG/ML solution 80 mL (80 mLs Intravenous Contrast Given 05/12/23 1623)    ED Course/ Medical Decision Making/ A&P                                 Medical Decision Making Amount and/or Complexity of Data Reviewed Labs: ordered. Radiology: ordered.  Risk Prescription drug management.   Patient is a 38 year old male presents the ED today with a 1 day history of right lower quadrant abdominal pain that has  been accompanied with nausea, 1 episode of emesis, 2 episodes of diarrhea after ingesting dairy.  States that he has had generalized intolerance for dairy however more recently has become more intolerant and is now experiencing some generalized abdominal discomfort.  On evaluation, patient was noted to have mild generalized abdominal tenderness specifically locating to the right lower quadrant.  But exam was otherwise unremarkable.  Patient later expressed that he had headache while labs and imaging were being done and requested medication, Toradol was given, patient symptoms have been relieved on reevaluation.  Labs were unremarkable and imaging was also unremarkable.  His symptoms appear to be more likely due to food intolerance versus acute emergency including low suspicion for appendicitis, diverticulitis, cholecystitis, hepatitis, AAA, ACS.  Due to both reassuring labs, physical exam, imaging recommended that he follow-up with PCP for further workup.  Patient vital signs remained stable for the course of his time here.  I believe patient is safe to discharge at this time.  Patient expressed agreement understanding of  plan.  Will discharge patient with Zofran for continued nausea.  Has been tolerating p.o. intake while in the ED.  Differential diagnoses prior to evaluation: The emergent differential diagnosis includes, but is not limited to, appendicitis, diverticulitis, cholecystitis, hepatitis, pancreatitis, gastritis, gastroenteritis, foodborne illness, food intolerance, AAA, ACS. This is not an exhaustive differential.   Past Medical History / Co-morbidities / Social History: IBS  Additional history: Chart reviewed. Pertinent results include: Seen twice in 2024 for abdominal pain and was noted to have IBS-like symptoms where he was to follow-up with PCP for further workup with no emergent process present at those times.  Lab Tests/Imaging studies: I personally interpreted labs/imaging and the pertinent results include:   CBC unremarkable CMP shows mild hypokalemia 3.3 but is otherwise unremarkable Lipase unremarkable UA unremarkable Respiratory panel unremarkable CT of abdomen pelvis with contrast was unremarkable I agree with the radiologist interpretation.    Medications: I ordered medication including  Toradol for headache.  I have reviewed the patients home medicines and have made adjustments as needed.    Disposition: After consideration of the diagnostic results and the patients response to treatment, I feel that the patient benefit from discharge treatment as above.   emergency department workup does not suggest an emergent condition requiring admission or immediate intervention beyond what has been performed at this time. The plan is: Zofran at home, follow-up PCP, return for new or worsening symptoms. The patient is safe for discharge and has been instructed to return immediately for worsening symptoms, change in symptoms or any other concerns.   Final Clinical Impression(s) / ED Diagnoses Final diagnoses:  Diarrhea, unspecified type  Right lower quadrant abdominal pain    Rx /  DC Orders ED Discharge Orders          Ordered    ondansetron (ZOFRAN) 4 MG tablet  Every 8 hours PRN        05/12/23 1719          Portions of this report may have been transcribed using voice recognition software. Every effort was made to ensure accuracy; however, inadvertent computerized transcription errors may be present.     Lunette Stands, [place] 05/12/23 1811    Rolan Bucco, MD 05/15/23 [phone]

## 2023-05-12 NOTE — ED Triage Notes (Signed)
 Pt coming to ER with complaint of vomiting starting last night with abdominal bloating x "few days". Pt reports taking OTC nausea medication with mild relief. Pt states today he has not vomited but has been constantly nauseous, unable to eat, and feeling run down. Denies fever. 1 episode of diarrhea reported.

## 2023-05-12 NOTE — Discharge Instructions (Addendum)
 You were seen today for diarrhea, and abdominal pain.  Your labs and imaging were both very reassuring at this time that no emergent processes present at this time.  Suspect this might be due to some sort of food intolerance due to you saying that symptoms are worse with dairy as well as with other specific foods.  I would recommend that you follow-up with your PCP for further workup and evaluation for food intolerances.  I have prescribed Zofran for you to take for nausea.  You can take this every 6-8 hours as needed for nausea.  Return to the ED if you begin to experience any new or worsening symptoms including worsening abdominal pain, unable to tolerate oral intake including food and fluids, shortness of breath, chest pain, fever
